# Patient Record
Sex: Male | Born: 1965 | Race: White | Hispanic: No | Marital: Married | Smoking: Former smoker
Health system: Southern US, Community
[De-identification: ages and names within clinical notes are randomized; demographics above are authoritative.]

## PROBLEM LIST (undated history)

## (undated) DIAGNOSIS — R7303 Prediabetes: Secondary | ICD-10-CM

## (undated) DIAGNOSIS — J449 Chronic obstructive pulmonary disease, unspecified: Secondary | ICD-10-CM

## (undated) DIAGNOSIS — M51369 Other intervertebral disc degeneration, lumbar region without mention of lumbar back pain or lower extremity pain: Secondary | ICD-10-CM

## (undated) DIAGNOSIS — F17200 Nicotine dependence, unspecified, uncomplicated: Secondary | ICD-10-CM

## (undated) DIAGNOSIS — M5136 Other intervertebral disc degeneration, lumbar region: Secondary | ICD-10-CM

## (undated) DIAGNOSIS — T8859XA Other complications of anesthesia, initial encounter: Secondary | ICD-10-CM

## (undated) DIAGNOSIS — M1990 Unspecified osteoarthritis, unspecified site: Secondary | ICD-10-CM

## (undated) DIAGNOSIS — J069 Acute upper respiratory infection, unspecified: Secondary | ICD-10-CM

## (undated) DIAGNOSIS — R972 Elevated prostate specific antigen [PSA]: Secondary | ICD-10-CM

## (undated) HISTORY — PX: LUMBAR DISC SURGERY: SHX700

## (undated) HISTORY — DX: Other intervertebral disc degeneration, lumbar region: M51.36

## (undated) HISTORY — PX: BACK SURGERY: SHX140

## (undated) HISTORY — DX: Nicotine dependence, unspecified, uncomplicated: F17.200

## (undated) HISTORY — DX: Other intervertebral disc degeneration, lumbar region without mention of lumbar back pain or lower extremity pain: M51.369

## (undated) HISTORY — PX: COLONOSCOPY: SHX174

## (undated) HISTORY — PX: PROSTATE BIOPSY: SHX241

---

## 2007-10-24 ENCOUNTER — Emergency Department (HOSPITAL_COMMUNITY): Admission: EM | Admit: 2007-10-24 | Discharge: 2007-10-24 | Payer: Self-pay | Admitting: Emergency Medicine

## 2008-12-31 ENCOUNTER — Ambulatory Visit: Payer: Self-pay | Admitting: Family Medicine

## 2008-12-31 DIAGNOSIS — R109 Unspecified abdominal pain: Secondary | ICD-10-CM | POA: Insufficient documentation

## 2010-02-25 ENCOUNTER — Ambulatory Visit (HOSPITAL_COMMUNITY)
Admission: RE | Admit: 2010-02-25 | Discharge: 2010-02-25 | Disposition: A | Discharge status: Home / Self Care | Payer: Self-pay | Source: Ambulatory Visit | Attending: Specialist | Admitting: Specialist

## 2010-02-25 ENCOUNTER — Encounter (HOSPITAL_COMMUNITY): Payer: Self-pay

## 2010-02-25 ENCOUNTER — Other Ambulatory Visit: Payer: Self-pay | Admitting: Specialist

## 2010-02-25 ENCOUNTER — Other Ambulatory Visit (HOSPITAL_COMMUNITY): Payer: Self-pay | Admitting: Specialist

## 2010-02-25 DIAGNOSIS — Z01818 Encounter for other preprocedural examination: Secondary | ICD-10-CM | POA: Insufficient documentation

## 2010-02-25 DIAGNOSIS — Z01810 Encounter for preprocedural cardiovascular examination: Secondary | ICD-10-CM | POA: Insufficient documentation

## 2010-02-25 DIAGNOSIS — M5126 Other intervertebral disc displacement, lumbar region: Secondary | ICD-10-CM | POA: Insufficient documentation

## 2010-02-25 DIAGNOSIS — Z01812 Encounter for preprocedural laboratory examination: Secondary | ICD-10-CM | POA: Insufficient documentation

## 2010-02-25 LAB — COMPREHENSIVE METABOLIC PANEL
ALT: 25 U/L (ref 0–53)
AST: 21 U/L (ref 0–37)
Albumin: 4.1 g/dL (ref 3.5–5.2)
CO2: 29 mEq/L (ref 19–32)
Chloride: 101 mEq/L (ref 96–112)
Creatinine, Ser: 1.16 mg/dL (ref 0.4–1.5)
GFR calc Af Amer: >60 mL/min (ref >60)
GFR calc non Af Amer: >60 mL/min (ref >60)
Potassium: 4.5 mEq/L (ref 3.5–5.1)
Sodium: 139 mEq/L (ref 135–145)
Total Bilirubin: 0.6 mg/dL (ref 0.3–1.2)

## 2010-02-25 LAB — URINALYSIS, ROUTINE W REFLEX MICROSCOPIC
Bilirubin Urine: NEGATIVE
Nitrite: NEGATIVE
Specific Gravity, Urine: 1.021 (ref 1.005–1.030)
Urobilinogen, UA: 0.2 mg/dL (ref 0.0–1.0)
pH: 7.5 (ref 5.0–8.0)

## 2010-02-25 LAB — CBC
Hemoglobin: 15.9 g/dL (ref 13.0–17.0)
MCH: 29.7 pg (ref 26.0–34.0)
Platelets: 278 10*3/uL (ref 150–400)
RBC: 5.36 MIL/uL (ref 4.22–5.81)
WBC: 18.7 10*3/uL — ABNORMAL HIGH (ref 4.0–10.5)

## 2010-02-25 LAB — APTT: aPTT: 32 seconds (ref 24–37)

## 2010-02-27 LAB — MRSA CULTURE

## 2010-03-04 ENCOUNTER — Ambulatory Visit (HOSPITAL_COMMUNITY): Payer: Worker's Compensation

## 2010-03-04 ENCOUNTER — Other Ambulatory Visit: Payer: Self-pay | Admitting: Specialist

## 2010-03-04 ENCOUNTER — Observation Stay (HOSPITAL_COMMUNITY)
Admission: RE | Admit: 2010-03-04 | Discharge: 2010-03-05 | Disposition: A | Discharge status: Home / Self Care | Payer: Worker's Compensation | Source: Ambulatory Visit | Attending: Specialist | Admitting: Specialist

## 2010-03-04 DIAGNOSIS — Z01812 Encounter for preprocedural laboratory examination: Secondary | ICD-10-CM | POA: Insufficient documentation

## 2010-03-04 DIAGNOSIS — M5126 Other intervertebral disc displacement, lumbar region: Principal | ICD-10-CM | POA: Insufficient documentation

## 2010-03-04 DIAGNOSIS — IMO0002 Reserved for concepts with insufficient information to code with codable children: Secondary | ICD-10-CM | POA: Insufficient documentation

## 2010-03-04 DIAGNOSIS — M4800 Spinal stenosis, site unspecified: Secondary | ICD-10-CM | POA: Insufficient documentation

## 2010-03-04 DIAGNOSIS — M549 Dorsalgia, unspecified: Secondary | ICD-10-CM | POA: Insufficient documentation

## 2010-03-04 LAB — CBC
HCT: 43.7 % (ref 39.0–52.0)
MCV: 86 fL (ref 78.0–100.0)
RBC: 5.08 MIL/uL (ref 4.22–5.81)
WBC: 11.8 10*3/uL — ABNORMAL HIGH (ref 4.0–10.5)

## 2010-03-13 NOTE — Op Note (Signed)
NAMERAMELO, OETKEN NO.:  [id number]  MEDICAL RECORD NO.:  [id number]           PATIENT TYPE:  O  LOCATION:  DAYL                         FACILITY:  [facility]  PHYSICIAN:  Jene Every, M.D.    DATE OF BIRTH:  08-Jun-1965  DATE OF PROCEDURE:  03/04/2010 DATE OF DISCHARGE:                              OPERATIVE REPORT   PREOPERATIVE DIAGNOSES: 1. Recurrent disk herniation L5-S1. 2. Spinal stenosis. 3. Disk degeneration.  POSTOPERATIVE DIAGNOSES: 1. Recurrent disk herniation L5-S1. 2. Spinal stenosis. 3. Disk degeneration.  PROCEDURE PERFORMED: 1. Redo decompression of L5-S1 right utilizing operating microscope. 2. Microdiskectomy L5-S1 right. 3. Foraminotomies of S1 and L5, right.  ANESTHESIA:  General.  ASSISTANT:  Roma Schanz, P.A.  BRIEF HISTORY:  This is a 45 year old with right lower extremity radicular pain secondary to recurrent disk herniations, had a previous history of a disk decompression at L5-S1 about 7 years ago, done well with that.  It was compressing the S1 nerve root.  Neural foraminal stenosis was noted.  He had a L5-S1 radiculopathy, indicated for decompression.  We discussed at length the redo decompression versus redo decompression and fusion.  He has absolutely no back pain.  He had an interval period of time in which he was asymptomatic.  We discussed decompression at L5-S1 as the more conservative approach given the above- mentioned factors, smoker as well and had not committed to tobacco cessation.  Risks and discussed were discussed including bleeding, infection, damage to vascular structures, no change in symptoms, worsening symptoms, need for repeat debridement, DVT, PE, anesthetic complication, need for fusion in the future, etc.  TECHNIQUE:  With the patient in supine position, after induction of adequate anesthesia, 1 g of Kefzol, he was placed prone on the [place] frame.  All bony prominences were well padded.   Lumbar region was prepped and draped in the usual sterile fashion.  Two 18 gauge spinal needles were utilized to localize L5-S1 interspace confirmed with x-ray. Incision was made just a centimeter off the midline at L5-S1. Subcutaneous tissue was dissected by cautery to achieve hemostasis. Dorsolumbar fascia identified via lumbar skin incision.  Paraspinous muscle elevated from lamina of L5 and S1 as well as the scar tissue and epidural fibrosis.  We skeletonized the previous laminotomy at L5-S1. Utilizing the straight curette, we detached the epidural fibrosis from the cephalad edge of the S1, caudad edge of L5.  We used a micro osteotome, removed the inferior half of L5 preserving the pars and performed a partial medial hemifacetectomy less than 50% to gain access to the L5-S1 space.  Following this, performed foraminotomies of S1 and L5 extensively.  We mobilized the thecal sac with epidural fibrosis meticulously, identified the disk herniation, confirmed it by x-ray, performed an annulotomy, and removed disk material from the disk space with a micropituitary and nerve root fragments and one large fragment was removed from beneath the subannular space.  We removed all herniated disk material.  We had good excursion of the S1 nerve root to leave the pedicle after the decompression 1 cm.  Hockey stick probe passed freely up the  foramen of L5 and of S1.  L5 root was intact as well as the S1 nerve root.  He did have a neural foraminal stenosis at L5, therefore, we performed a foraminotomy of L5 to avoid compression by the facet joint in the upright position.  Disk space was copiously irrigated with antibiotic irrigation.  There was no evidence of CSF leaks or active bleeding.  We had used bipolar electrocautery to achieve hemostasis. Copiously irrigated disk space and the surgical site with antibiotic irrigation, removed the McCullough retractor, no evidence of active bleeding or CSF  leakage, irrigated paraspinous musculature, closed fascia with 1 Vicryl interrupted figure-of-8 sutures, subcutaneous with 2-0 Vicryl, and skin was reapproximated with 4-0 subcuticular Prolene. Wound reinforced with Steri-Strips.  Sterile dressing applied.  Placed supine on the hospital bed, extubated without difficulty, and transported to Recovery in satisfactory condition.  The patient tolerated the procedure with no complications.  BLOOD LOSS:  Minimal.  SPECIMEN TO PATHOLOGY:  L5-S1 disk.     Jene Every, M.D.     Cordelia Pen  D:  03/04/2010  T:  03/04/2010  Job:  [id number]  Electronically Signed by Jene Every M.D. on 03/13/2010 05:49:56 AM

## 2010-12-16 ENCOUNTER — Other Ambulatory Visit: Payer: Self-pay | Admitting: Otolaryngology

## 2011-02-02 ENCOUNTER — Encounter (HOSPITAL_COMMUNITY): Payer: Self-pay | Admitting: Pharmacy Technician

## 2011-02-04 ENCOUNTER — Other Ambulatory Visit (HOSPITAL_COMMUNITY): Payer: Self-pay | Admitting: *Deleted

## 2011-02-04 NOTE — Pre-Procedure Instructions (Signed)
20 Christopher Wells  02/04/2011   Your procedure is scheduled on: Thursday, Februrary 7th.  Report to [facility] at 5:30AM.   Call this number if you have problems the morning of surgery: [phone]   Remember:   Do not eat food:After Midnight.  May have clear liquids: up to 4 Hours before arrival. 1:30   Clear liquids include soda, tea, black coffee, apple or grape juice, broth.    Take these medicines the morning of surgery with A SIP OF WATER: Hydroodone- Acetaminophen or Acetaminophen if needed.    Do not wear jewelry, make-up or nail polish.  Do not wear lotions, powders, or perfumes. You may wear deodorant.  Do not shave 48 hours prior to surgery.  Do not bring valuables to the hospital.  Contacts, dentures or bridgework may not be worn into surgery.  Leave suitcase in the car. After surgery it may be brought to your room.  For patients admitted to the hospital, checkout time is 11:00 AM the day of discharge.   Patients discharged the day of surgery will not be allowed to drive home.  Name and phone number of your driver: NA   Special Instructions: CHG Shower Use Special Wash: 1/2 bottle night before surgery and 1/2 bottle morning of surgery.   Please read over the following fact sheets that you were given: Pain Booklet, Coughing and Deep Breathing, Blood Transfusion Information, MRSA Information and Surgical Site Infection Prevention

## 2011-02-05 ENCOUNTER — Other Ambulatory Visit: Payer: Self-pay

## 2011-02-05 ENCOUNTER — Inpatient Hospital Stay (HOSPITAL_COMMUNITY): Admission: RE | Admit: 2011-02-05 | Discharge: 2011-02-05 | Payer: Self-pay | Source: Ambulatory Visit

## 2011-02-05 NOTE — H&P (Signed)
Christopher Wells is an 46 y.o. male.   Chief Complaint:  right leg pain and numbness with occasional low back pain HPI: Patient has a long standing history of back related issues following a work related injury.  He has undergone several back surgeries with short term relief but continues to recurrent pain. Studies show recurrent HNP at L5-S1 with severe DDD.  The patient has failed conservative treatment.  Dr Shelle Iron feels he would benefit from ALF. Risks and benefits of surgery discussed with patient and they wish to proceed.  PMH: none  PSH:  Lumbar decompression x3  FH:  Father deceased age 30 from CAD, triple bypass at age 26, Mother deceased age 44 history of DM  Social History:  Past TOB quit 1 year ago, negative for ETOH and illegal drugs, married with 9 children, family will be care givers following surgery. Allergies: No Known Allergies  No current facility-administered medications on file as of .   Medications Prior to Admission  Medication Sig Dispense Refill  . acetaminophen (TYLENOL) 500 MG tablet Take 500 mg by mouth every 6 (six) hours as needed. For pain      . HYDROcodone-acetaminophen (NORCO) 7.5-325 MG per tablet Take 1 tablet by mouth every 8 (eight) hours as needed. For pain      . Phenyleph-Doxylamine-DM-APAP (ALKA-SELTZER PLS NIGHT CLD/FLU) 5-6.25-10-325 MG CAPS Take 1 tablet by mouth 2 (two) times daily as needed. For cold        No results found for this or any previous visit (from the past 48 hour(s)). No results found.  Review of Systems: The patient denies any fever, chills, night sweats, or bleeding tendencies. CNS: No blurred double vision, seizure, headache, paralysis. RESPIRATORY: recent head congestion No shortness of breath, productive cough, or hemoptysis. CARDIOVASCULAR: No chest pain, angina, or orthopnea, GU: No dysuria, hematuria, or discharge. MUSCULOSKELETAL: Pertinent per HPI.   Vitals: Pulse: 76  Resp: 10  BP: 126/84 General appearance: alert,  cooperative and mild distress Head: Normocephalic, without obvious abnormality, atraumatic Neck: no adenopathy, no carotid bruit, no JVD, supple, symmetrical, trachea midline and thyroid not enlarged, symmetric, no tenderness/mass/nodules Back: mild pain with flexion and extension, global loss of ROM Chest wall: no tenderness Cardio: regular rate and rhythm, S1, S2 normal, no murmur, click, rub or gallop GI: soft, non-tender; bowel sounds normal; no masses,  no organomegaly Extremities: positive SLR on right negative on the left EHL 5/5  Assessment/Plan [facility]F at L5-S1 by Dr Avel Sensor R. 02/05/2011, 11:25 AM

## 2011-02-08 ENCOUNTER — Encounter (HOSPITAL_COMMUNITY): Payer: Self-pay

## 2011-02-08 ENCOUNTER — Other Ambulatory Visit (HOSPITAL_COMMUNITY): Payer: Self-pay | Admitting: Specialist

## 2011-02-08 ENCOUNTER — Encounter (HOSPITAL_COMMUNITY)
Admission: RE | Admit: 2011-02-08 | Discharge: 2011-02-08 | Disposition: A | Discharge status: Home / Self Care | Payer: Worker's Compensation | Source: Ambulatory Visit | Attending: Specialist | Admitting: Specialist

## 2011-02-08 ENCOUNTER — Ambulatory Visit (HOSPITAL_COMMUNITY)
Admission: RE | Admit: 2011-02-08 | Discharge: 2011-02-08 | Disposition: A | Discharge status: Home / Self Care | Payer: Worker's Compensation | Source: Ambulatory Visit | Attending: Otolaryngology | Admitting: Otolaryngology

## 2011-02-08 DIAGNOSIS — M5126 Other intervertebral disc displacement, lumbar region: Secondary | ICD-10-CM

## 2011-02-08 HISTORY — DX: Unspecified osteoarthritis, unspecified site: M19.90

## 2011-02-08 HISTORY — DX: Acute upper respiratory infection, unspecified: J06.9

## 2011-02-08 LAB — CBC
HCT: 44.8 % (ref 39.0–52.0)
Hemoglobin: 15.8 g/dL (ref 13.0–17.0)
MCV: 84.8 fL (ref 78.0–100.0)
RBC: 5.28 MIL/uL (ref 4.22–5.81)
RDW: 12.4 % (ref 11.5–15.5)
WBC: 10.5 10*3/uL (ref 4.0–10.5)

## 2011-02-08 LAB — COMPREHENSIVE METABOLIC PANEL
Albumin: 4 g/dL (ref 3.5–5.2)
Alkaline Phosphatase: 66 U/L (ref 39–117)
BUN: 13 mg/dL (ref 6–23)
CO2: 26 mEq/L (ref 19–32)
Chloride: 102 mEq/L (ref 96–112)
Creatinine, Ser: 1.1 mg/dL (ref 0.50–1.35)
GFR calc non Af Amer: 78 mL/min — ABNORMAL LOW (ref >90)
Potassium: 3.9 mEq/L (ref 3.5–5.1)
Total Bilirubin: 0.3 mg/dL (ref 0.3–1.2)

## 2011-02-08 LAB — TYPE AND SCREEN
ABO/RH(D): O POS
Antibody Screen: NEGATIVE

## 2011-02-08 LAB — URINALYSIS, ROUTINE W REFLEX MICROSCOPIC
Bilirubin Urine: NEGATIVE
Glucose, UA: NEGATIVE mg/dL
Ketones, ur: NEGATIVE mg/dL
Protein, ur: NEGATIVE mg/dL
pH: 6 (ref 5.0–8.0)

## 2011-02-08 LAB — DIFFERENTIAL
Basophils Absolute: 0 10*3/uL (ref 0.0–0.1)
Basophils Relative: 0 % (ref 0–1)
Eosinophils Absolute: 0.3 10*3/uL (ref 0.0–0.7)
Monocytes Absolute: 0.9 10*3/uL (ref 0.1–1.0)
Monocytes Relative: 9 % (ref 3–12)
Neutrophils Relative %: 57 % (ref 43–77)

## 2011-02-08 LAB — ABO/RH: ABO/RH(D): O POS

## 2011-02-08 LAB — PROTIME-INR: INR: 0.92 (ref 0.00–1.49)

## 2011-02-08 MED ORDER — CHLORHEXIDINE GLUCONATE 4 % EX LIQD: 60.0000 mL | Freq: Once | CUTANEOUS | Status: DC

## 2011-02-08 NOTE — Pre-Procedure Instructions (Signed)
20 WILBORN MEMBRENO  02/08/2011   Your procedure is scheduled on:  02/11/2011  Report to [facility] at 5:30 AM.  Call this number if you have problems the morning of surgery: [phone]   Remember:   Do not eat food:After Midnight.  May have clear liquids: up to 4 Hours before arrival.  Clear liquids include soda, tea, black coffee, apple or grape juice, broth.  Take these medicines the morning of surgery with A SIP OF WATER:  Nothing    Do not wear jewelry, make-up or nail polish.  Do not wear lotions, powders, or perfumes. You may wear deodorant.  Do not shave 48 hours prior to surgery.  Do not bring valuables to the hospital.  Contacts, dentures or bridgework may not be worn into surgery.  Leave suitcase in the car. After surgery it may be brought to your room.  For patients admitted to the hospital, checkout time is 11:00 AM the day of discharge.   Patients discharged the day of surgery will not be allowed to drive home.  Name and phone number of your driver:   With wife  Special Instructions: CHG Shower Use Special Wash: 1/2 bottle night before surgery and 1/2 bottle morning of surgery.   Please read over the following fact sheets that you were given: Pain Booklet, Coughing and Deep Breathing, Blood Transfusion Information, MRSA Information and Surgical Site Infection Prevention

## 2011-02-10 MED ORDER — CEFAZOLIN SODIUM 1-5 GM-% IV SOLN
1.0000 g | INTRAVENOUS | Status: AC
  Administered 2011-02-11: 1 g via INTRAVENOUS
  Filled 2011-02-10: qty 50

## 2011-02-10 MED ORDER — LACTATED RINGERS IV SOLN: INTRAVENOUS | Status: DC

## 2011-02-11 ENCOUNTER — Ambulatory Visit (HOSPITAL_COMMUNITY): Payer: Worker's Compensation

## 2011-02-11 ENCOUNTER — Ambulatory Visit (HOSPITAL_COMMUNITY): Payer: Worker's Compensation | Admitting: Anesthesiology

## 2011-02-11 ENCOUNTER — Encounter (HOSPITAL_COMMUNITY)
Admission: RE | Disposition: A | Discharge status: Home / Self Care | Payer: Self-pay | Source: Ambulatory Visit | Attending: Specialist

## 2011-02-11 ENCOUNTER — Inpatient Hospital Stay (HOSPITAL_COMMUNITY)
Admission: RE | Admit: 2011-02-11 | Discharge: 2011-02-13 | DRG: 460 | Disposition: A | Discharge status: Home / Self Care | Payer: Worker's Compensation | Source: Ambulatory Visit | Attending: Specialist | Admitting: Specialist

## 2011-02-11 ENCOUNTER — Encounter (HOSPITAL_COMMUNITY): Payer: Self-pay | Admitting: *Deleted

## 2011-02-11 ENCOUNTER — Encounter (HOSPITAL_COMMUNITY): Payer: Self-pay | Admitting: Anesthesiology

## 2011-02-11 DIAGNOSIS — M5136 Other intervertebral disc degeneration, lumbar region: Secondary | ICD-10-CM

## 2011-02-11 DIAGNOSIS — Z87891 Personal history of nicotine dependence: Secondary | ICD-10-CM

## 2011-02-11 DIAGNOSIS — M51379 Other intervertebral disc degeneration, lumbosacral region without mention of lumbar back pain or lower extremity pain: Principal | ICD-10-CM | POA: Diagnosis present

## 2011-02-11 DIAGNOSIS — M549 Dorsalgia, unspecified: Secondary | ICD-10-CM

## 2011-02-11 DIAGNOSIS — M5137 Other intervertebral disc degeneration, lumbosacral region: Principal | ICD-10-CM | POA: Diagnosis present

## 2011-02-11 DIAGNOSIS — Z8249 Family history of ischemic heart disease and other diseases of the circulatory system: Secondary | ICD-10-CM

## 2011-02-11 HISTORY — PX: ANTERIOR LUMBAR FUSION: SHX1170

## 2011-02-11 SURGERY — ANTERIOR LUMBAR FUSION 1 LEVEL
Anesthesia: General | Site: Abdomen | Wound class: Clean

## 2011-02-11 MED ORDER — HYDROMORPHONE HCL PF 1 MG/ML IJ SOLN
INTRAMUSCULAR | Status: AC
  Administered 2011-02-11: 0.5 mg via INTRAVENOUS
  Filled 2011-02-11: qty 1

## 2011-02-11 MED ORDER — ACETAMINOPHEN 325 MG PO TABS
650.0000 mg | ORAL_TABLET | ORAL | Status: DC | PRN
  Administered 2011-02-12: 650 mg via ORAL
  Filled 2011-02-11: qty 1
  Filled 2011-02-11: qty 2

## 2011-02-11 MED ORDER — NEOSTIGMINE METHYLSULFATE 1 MG/ML IJ SOLN
INTRAMUSCULAR | Status: DC | PRN
  Administered 2011-02-11: 3 mg via INTRAVENOUS

## 2011-02-11 MED ORDER — SURGIFOAM 100 EX MISC
CUTANEOUS | Status: DC | PRN
  Administered 2011-02-11: 09:00:00 via TOPICAL

## 2011-02-11 MED ORDER — 0.9 % SODIUM CHLORIDE (POUR BTL) OPTIME
TOPICAL | Status: DC | PRN
  Administered 2011-02-11: 1000 mL

## 2011-02-11 MED ORDER — HYDROMORPHONE 0.3 MG/ML IV SOLN
INTRAVENOUS | Status: DC
  Administered 2011-02-11: 17:00:00 via INTRAVENOUS
  Administered 2011-02-11: 0.1 mg via INTRAVENOUS
  Administered 2011-02-12: 0.599 mg via INTRAVENOUS
  Administered 2011-02-12: 0.1 mg via INTRAVENOUS

## 2011-02-11 MED ORDER — NALOXONE HCL 0.4 MG/ML IJ SOLN: 0.4000 mg | INTRAMUSCULAR | Status: DC | PRN

## 2011-02-11 MED ORDER — SODIUM CHLORIDE 0.9 % IV SOLN
INTRAVENOUS | Status: DC | PRN
  Administered 2011-02-11: 11:00:00 via INTRAVENOUS

## 2011-02-11 MED ORDER — DIPHENHYDRAMINE HCL 50 MG/ML IJ SOLN: 12.5000 mg | Freq: Four times a day (QID) | INTRAMUSCULAR | Status: DC | PRN

## 2011-02-11 MED ORDER — ACETAMINOPHEN 10 MG/ML IV SOLN
INTRAVENOUS | Status: AC
  Filled 2011-02-11: qty 100

## 2011-02-11 MED ORDER — SODIUM CHLORIDE 0.9 % IJ SOLN
3.0000 mL | INTRAMUSCULAR | Status: DC | PRN
  Administered 2011-02-12 (×2): 3 mL via INTRAVENOUS

## 2011-02-11 MED ORDER — PROMETHAZINE HCL 25 MG/ML IJ SOLN
12.5000 mg | Freq: Four times a day (QID) | INTRAMUSCULAR | Status: DC | PRN
  Administered 2011-02-11: 12.5 mg via INTRAVENOUS
  Filled 2011-02-11: qty 1

## 2011-02-11 MED ORDER — DOCUSATE SODIUM 100 MG PO CAPS
100.0000 mg | ORAL_CAPSULE | Freq: Two times a day (BID) | ORAL | Status: DC
  Administered 2011-02-11 – 2011-02-13 (×4): 100 mg via ORAL
  Filled 2011-02-11 (×5): qty 1

## 2011-02-11 MED ORDER — LACTATED RINGERS IV SOLN
INTRAVENOUS | Status: DC | PRN
  Administered 2011-02-11 (×2): via INTRAVENOUS

## 2011-02-11 MED ORDER — ONDANSETRON HCL 4 MG/2ML IJ SOLN
4.0000 mg | INTRAMUSCULAR | Status: DC | PRN
  Filled 2011-02-11: qty 2

## 2011-02-11 MED ORDER — PROPOFOL 10 MG/ML IV EMUL
INTRAVENOUS | Status: DC | PRN
  Administered 2011-02-11: 130 mg via INTRAVENOUS
  Administered 2011-02-11: 30 mg via INTRAVENOUS

## 2011-02-11 MED ORDER — FENTANYL CITRATE 0.05 MG/ML IJ SOLN
INTRAMUSCULAR | Status: DC | PRN
  Administered 2011-02-11: 50 ug via INTRAVENOUS
  Administered 2011-02-11: 100 ug via INTRAVENOUS
  Administered 2011-02-11 (×2): 50 ug via INTRAVENOUS
  Administered 2011-02-11: 100 ug via INTRAVENOUS

## 2011-02-11 MED ORDER — SODIUM CHLORIDE 0.9 % IJ SOLN: 9.0000 mL | INTRAMUSCULAR | Status: DC | PRN

## 2011-02-11 MED ORDER — ACETAMINOPHEN 650 MG RE SUPP: 650.0000 mg | RECTAL | Status: DC | PRN

## 2011-02-11 MED ORDER — DEXTROSE-NACL 5-0.45 % IV SOLN
INTRAVENOUS | Status: DC
  Administered 2011-02-11: 13:00:00 via INTRAVENOUS

## 2011-02-11 MED ORDER — GLYCOPYRROLATE 0.2 MG/ML IJ SOLN
INTRAMUSCULAR | Status: DC | PRN
  Administered 2011-02-11: .5 mg via INTRAVENOUS

## 2011-02-11 MED ORDER — PROMETHAZINE HCL 25 MG/ML IJ SOLN: 6.2500 mg | INTRAMUSCULAR | Status: DC | PRN

## 2011-02-11 MED ORDER — ONDANSETRON HCL 4 MG/2ML IJ SOLN
INTRAMUSCULAR | Status: DC | PRN
  Administered 2011-02-11: 4 mg via INTRAVENOUS

## 2011-02-11 MED ORDER — EPHEDRINE SULFATE 50 MG/ML IJ SOLN
INTRAMUSCULAR | Status: DC | PRN
  Administered 2011-02-11: 10 mg via INTRAVENOUS
  Administered 2011-02-11: 5 mg via INTRAVENOUS
  Administered 2011-02-11: 10 mg via INTRAVENOUS

## 2011-02-11 MED ORDER — DIPHENHYDRAMINE HCL 12.5 MG/5ML PO ELIX
12.5000 mg | ORAL_SOLUTION | Freq: Four times a day (QID) | ORAL | Status: DC | PRN
  Filled 2011-02-11: qty 5

## 2011-02-11 MED ORDER — HYDROCODONE-ACETAMINOPHEN 5-325 MG PO TABS: 1.0000 | ORAL_TABLET | ORAL | Status: DC | PRN

## 2011-02-11 MED ORDER — ROCURONIUM BROMIDE 100 MG/10ML IV SOLN
INTRAVENOUS | Status: DC | PRN
  Administered 2011-02-11: 10 mg via INTRAVENOUS
  Administered 2011-02-11: 30 mg via INTRAVENOUS

## 2011-02-11 MED ORDER — ACETAMINOPHEN 10 MG/ML IV SOLN
INTRAVENOUS | Status: DC | PRN
  Administered 2011-02-11: 1000 mg via INTRAVENOUS

## 2011-02-11 MED ORDER — HETASTARCH-ELECTROLYTES 6 % IV SOLN
INTRAVENOUS | Status: DC | PRN
  Administered 2011-02-11 (×2): via INTRAVENOUS

## 2011-02-11 MED ORDER — HYDROMORPHONE HCL PF 1 MG/ML IJ SOLN
0.2500 mg | INTRAMUSCULAR | Status: DC | PRN
  Administered 2011-02-11 (×3): 0.5 mg via INTRAVENOUS

## 2011-02-11 MED ORDER — PHENOL 1.4 % MT LIQD
1.0000 | OROMUCOSAL | Status: DC | PRN
  Filled 2011-02-11: qty 177

## 2011-02-11 MED ORDER — METHOCARBAMOL 500 MG PO TABS
500.0000 mg | ORAL_TABLET | Freq: Four times a day (QID) | ORAL | Status: DC | PRN
  Administered 2011-02-12 – 2011-02-13 (×3): 500 mg via ORAL
  Filled 2011-02-11 (×3): qty 1

## 2011-02-11 MED ORDER — MEPERIDINE HCL 25 MG/ML IJ SOLN: 6.2500 mg | INTRAMUSCULAR | Status: DC | PRN

## 2011-02-11 MED ORDER — ACETAMINOPHEN 500 MG PO TABS: 500.0000 mg | ORAL_TABLET | Freq: Four times a day (QID) | ORAL | Status: DC | PRN

## 2011-02-11 MED ORDER — MIDAZOLAM HCL 5 MG/5ML IJ SOLN
INTRAMUSCULAR | Status: DC | PRN
  Administered 2011-02-11: 3 mg via INTRAVENOUS
  Administered 2011-02-11: 1 mg via INTRAVENOUS

## 2011-02-11 MED ORDER — ONDANSETRON HCL 4 MG/2ML IJ SOLN
4.0000 mg | Freq: Four times a day (QID) | INTRAMUSCULAR | Status: DC | PRN
  Administered 2011-02-11: 4 mg via INTRAVENOUS

## 2011-02-11 MED ORDER — CEFAZOLIN SODIUM 1-5 GM-% IV SOLN
1.0000 g | Freq: Three times a day (TID) | INTRAVENOUS | Status: AC
  Administered 2011-02-11 – 2011-02-12 (×2): 1 g via INTRAVENOUS
  Filled 2011-02-11 (×2): qty 50

## 2011-02-11 MED ORDER — MENTHOL 3 MG MT LOZG: 1.0000 | LOZENGE | OROMUCOSAL | Status: DC | PRN

## 2011-02-11 MED ORDER — HYDROMORPHONE 0.3 MG/ML IV SOLN
INTRAVENOUS | Status: AC
  Administered 2011-02-11: 16:00:00
  Filled 2011-02-11: qty 25

## 2011-02-11 MED ORDER — RIVAROXABAN 10 MG PO TABS
10.0000 mg | ORAL_TABLET | Freq: Every day | ORAL | Status: DC
  Administered 2011-02-12 – 2011-02-13 (×2): 10 mg via ORAL
  Filled 2011-02-11 (×2): qty 1

## 2011-02-11 MED ORDER — METHOCARBAMOL 100 MG/ML IJ SOLN: 500.0000 mg | Freq: Four times a day (QID) | INTRAVENOUS | Status: DC | PRN

## 2011-02-11 MED ORDER — OXYCODONE-ACETAMINOPHEN 5-325 MG PO TABS
1.0000 | ORAL_TABLET | ORAL | Status: DC | PRN
Start: 2011-02-11 — End: 2011-02-13
  Administered 2011-02-12: 1 via ORAL
  Administered 2011-02-12: 2 via ORAL
  Administered 2011-02-12 (×2): 1 via ORAL
  Administered 2011-02-13: 2 via ORAL
  Administered 2011-02-13: 1 via ORAL
  Filled 2011-02-11 (×3): qty 2
  Filled 2011-02-11: qty 1
  Filled 2011-02-11: qty 2
  Filled 2011-02-11: qty 1

## 2011-02-11 MED ORDER — SODIUM CHLORIDE 0.9 % IJ SOLN: 3.0000 mL | Freq: Two times a day (BID) | INTRAMUSCULAR | Status: DC

## 2011-02-11 MED ORDER — SODIUM CHLORIDE 0.9 % IV SOLN: 250.0000 mL | INTRAVENOUS | Status: DC

## 2011-02-11 SURGICAL SUPPLY — 99 items
ADH SKN CLS APL DERMABOND .7 (GAUZE/BANDAGES/DRESSINGS) ×1
AEGIS FIXATION PIN SMOOTH ×4 IMPLANT
APL SKNCLS STERI-STRIP NONHPOA (GAUZE/BANDAGES/DRESSINGS) ×1
APPLIER CLIP 11 MED OPEN (CLIP) ×2
APR CLP MED 11 20 MLT OPN (CLIP) ×1
Aegis Lumbar Plate 21mm (Orthopedic Implant) ×2 IMPLANT
Aegis screw 5.2x24mm (Orthopedic Implant) ×8 IMPLANT
BENZOIN TINCTURE PRP APPL 2/3 (GAUZE/BANDAGES/DRESSINGS) ×2 IMPLANT
BLADE SURG 10 STRL SS (BLADE) ×2 IMPLANT
BLADE SURG ROTATE 9660 (MISCELLANEOUS) IMPLANT
CAGE LUMBAR COUGAR LG 12 10 (Cage) ×2 IMPLANT
CHLORAPREP W/TINT 26ML (MISCELLANEOUS) ×2 IMPLANT
CLIP APPLIE 11 MED OPEN (CLIP) ×1 IMPLANT
CLIP TI MEDIUM 24 (CLIP) IMPLANT
CLIP TI WIDE RED SMALL 24 (CLIP) IMPLANT
CLOTH BEACON ORANGE TIMEOUT ST (SAFETY) ×2 IMPLANT
CORDS BIPOLAR (ELECTRODE) ×2 IMPLANT
COVER MAYO STAND STRL (DRAPES) ×4 IMPLANT
COVER SURGICAL LIGHT HANDLE (MISCELLANEOUS) ×2 IMPLANT
COVER TABLE BACK 60X90 (DRAPES) ×2 IMPLANT
CUBE CONFORM 17MM (Orthopedic Implant) ×6 IMPLANT
DERMABOND ADVANCED (GAUZE/BANDAGES/DRESSINGS) ×1
DERMABOND ADVANCED .7 DNX12 (GAUZE/BANDAGES/DRESSINGS) ×1 IMPLANT
DRAIN PENROSE 1/4X12 LTX STRL (WOUND CARE) ×2 IMPLANT
DRAPE C-ARM 42X72 X-RAY (DRAPES) ×2 IMPLANT
DRAPE INCISE IOBAN 66X45 STRL (DRAPES) ×2 IMPLANT
DRAPE SURG 17X23 STRL (DRAPES) ×2 IMPLANT
DRSG MEPILEX BORDER 4X8 (GAUZE/BANDAGES/DRESSINGS) ×2 IMPLANT
ELECT BLADE 4.0 EZ CLEAN MEGAD (MISCELLANEOUS) ×2
ELECT CAUTERY BLADE 6.4 (BLADE) ×2 IMPLANT
ELECT REM PT RETURN 9FT ADLT (ELECTROSURGICAL) ×2
ELECTRODE BLDE 4.0 EZ CLN MEGD (MISCELLANEOUS) ×1 IMPLANT
ELECTRODE REM PT RTRN 9FT ADLT (ELECTROSURGICAL) ×1 IMPLANT
GAUZE SPONGE 4X4 16PLY XRAY LF (GAUZE/BANDAGES/DRESSINGS) IMPLANT
GLOVE BIO SURGEON STRL SZ8.5 (GLOVE) ×4 IMPLANT
GLOVE BIOGEL PI IND STRL 6.5 (GLOVE) ×2 IMPLANT
GLOVE BIOGEL PI IND STRL 7.5 (GLOVE) ×2 IMPLANT
GLOVE BIOGEL PI IND STRL 8.5 (GLOVE) ×2 IMPLANT
GLOVE BIOGEL PI INDICATOR 6.5 (GLOVE) ×2
GLOVE BIOGEL PI INDICATOR 7.5 (GLOVE) ×2
GLOVE BIOGEL PI INDICATOR 8.5 (GLOVE) ×2
GLOVE ECLIPSE 6.5 STRL STRAW (GLOVE) ×4 IMPLANT
GLOVE SURG SS PI 7.5 STRL IVOR (GLOVE) ×4 IMPLANT
GLOVE SURG SS PI 8.0 STRL IVOR (GLOVE) ×4 IMPLANT
GOWN EXTRA PROTECTION XL (GOWNS) ×4 IMPLANT
GOWN PREVENTION PLUS XLARGE (GOWN DISPOSABLE) ×4 IMPLANT
GOWN PREVENTION PLUS XXLARGE (GOWN DISPOSABLE) ×2 IMPLANT
GOWN STRL NON-REIN LRG LVL3 (GOWN DISPOSABLE) ×4 IMPLANT
HEMOSTAT SURGICEL 2X14 (HEMOSTASIS) IMPLANT
IMBIBE BONE MARROW ASPIRATION NEEDLE ×2 IMPLANT
INSERT FOGARTY 61MM (MISCELLANEOUS) IMPLANT
INSERT FOGARTY SM (MISCELLANEOUS) IMPLANT
KIT BASIN OR (CUSTOM PROCEDURE TRAY) ×2 IMPLANT
KIT ROOM TURNOVER OR (KITS) ×2 IMPLANT
LOOP VESSEL MAXI BLUE (MISCELLANEOUS) IMPLANT
LOOP VESSEL MINI RED (MISCELLANEOUS) IMPLANT
NEEDLE BONE MARROW 8GAX6 (NEEDLE) ×2 IMPLANT
NEEDLE SPNL 18GX3.5 QUINCKE PK (NEEDLE) ×2 IMPLANT
NS IRRIG 1000ML POUR BTL (IV SOLUTION) ×2 IMPLANT
PACK LAMINECTOMY ORTHO (CUSTOM PROCEDURE TRAY) ×2 IMPLANT
PAD ARMBOARD 7.5X6 YLW CONV (MISCELLANEOUS) ×4 IMPLANT
SPO2 SENSOR ×2 IMPLANT
SPONGE INTESTINAL PEANUT (DISPOSABLE) ×2 IMPLANT
SPONGE LAP 18X18 X RAY DECT (DISPOSABLE) ×2 IMPLANT
SPONGE LAP 4X18 X RAY DECT (DISPOSABLE) IMPLANT
SPONGE SURGIFOAM ABS GEL 100 (HEMOSTASIS) ×2 IMPLANT
STAPLER VISISTAT 35W (STAPLE) ×2 IMPLANT
STRIP CLOSURE SKIN 1/2X4 (GAUZE/BANDAGES/DRESSINGS) ×2 IMPLANT
SURGIFLO TRUKIT (HEMOSTASIS) IMPLANT
SUT MNCRL AB 3-0 PS2 18 (SUTURE) ×2 IMPLANT
SUT MNCRL AB 4-0 PS2 18 (SUTURE) IMPLANT
SUT PDS AB 1 CTX 36 (SUTURE) ×2 IMPLANT
SUT PROLENE 4 0 RB 1 (SUTURE)
SUT PROLENE 4-0 RB1 .5 CRCL 36 (SUTURE) IMPLANT
SUT PROLENE 5 0 CC1 (SUTURE) IMPLANT
SUT PROLENE 6 0 C 1 30 (SUTURE) IMPLANT
SUT PROLENE 6 0 CC (SUTURE) IMPLANT
SUT SILK 0 TIES 10X30 (SUTURE) IMPLANT
SUT SILK 2 0 TIES 10X30 (SUTURE) ×2 IMPLANT
SUT SILK 2 0SH CR/8 30 (SUTURE) IMPLANT
SUT SILK 3 0 TIES 10X30 (SUTURE) IMPLANT
SUT SILK 3 0SH CR/8 30 (SUTURE) IMPLANT
SUT VIC AB 0 CT1 27 (SUTURE)
SUT VIC AB 0 CT1 27XBRD ANBCTR (SUTURE) IMPLANT
SUT VIC AB 1 CTX 36 (SUTURE) ×2
SUT VIC AB 1 CTX36XBRD ANBCTR (SUTURE) ×1 IMPLANT
SUT VIC AB 2-0 CT1 27 (SUTURE) ×2
SUT VIC AB 2-0 CT1 36 (SUTURE) IMPLANT
SUT VIC AB 2-0 CT1 TAPERPNT 27 (SUTURE) ×1 IMPLANT
SUT VIC AB 3-0 SH 27 (SUTURE)
SUT VIC AB 3-0 SH 27X BRD (SUTURE) IMPLANT
SUT VIC AB 3-0 X1 27 (SUTURE) ×2 IMPLANT
SYR 30ML LL (SYRINGE) ×2 IMPLANT
SYR BULB IRRIGATION 50ML (SYRINGE) ×2 IMPLANT
SYRINGE 10CC LL (SYRINGE) ×2 IMPLANT
TOWEL OR 17X24 6PK STRL BLUE (TOWEL DISPOSABLE) ×4 IMPLANT
TOWEL OR 17X26 10 PK STRL BLUE (TOWEL DISPOSABLE) ×4 IMPLANT
TRAY FOLEY CATH 14FRSI W/METER (CATHETERS) ×2 IMPLANT
WATER STERILE IRR 1000ML POUR (IV SOLUTION) ×2 IMPLANT

## 2011-02-11 NOTE — Brief Op Note (Signed)
02/11/2011  11:43 AM  PATIENT:  Christopher Wells  46 y.o. male  PRE-OPERATIVE DIAGNOSIS:  Degenerative joint disease L5-S1  POST-OPERATIVE DIAGNOSIS:  Degenerative joint disease L5-S1  PROCEDURE:  Procedure(s): ANTERIOR LUMBAR FUSION 1 LEVEL ABDOMINAL EXPOSURE  SURGEON:  Surgeon(s): Javier Docker, MD V Durene Cal, MD  PHYSICIAN ASSISTANT:   ASSISTANTS: strader   ANESTHESIA:   general  EBL:  Total I/O In: 4500 [I.V.:3500; IV Piggyback:1000] Out: 430 [Urine:130; Blood:300]  BLOOD ADMINISTERED:none  DRAINS: none   LOCAL MEDICATIONS USED:  MARCAINE 20CC  SPECIMEN:  No Specimen  DISPOSITION OF SPECIMEN:  N/A  COUNTS:  YES  TOURNIQUET:  * No tourniquets in log *  DICTATION: .Other Dictation: Dictation Number [id number]  PLAN OF CARE: Admit to inpatient   PATIENT DISPOSITION:  PACU - hemodynamically stable.   Delay start of Pharmacological VTE agent (>24hrs) due to surgical blood loss or risk of bleeding:  {YES/NO/NOT APPLICABLE:20182

## 2011-02-11 NOTE — Op Note (Signed)
[facility]  Patient name: Christopher Wells MRN: [id number] DOB: Feb 11, 1965 Sex: male  02/11/2011 Pre-operative Diagnosis:Degenerative back disease, L5-S1 Post-operative diagnosis:  Same Surgeon:  Jorge Ny Assistants:  Dr. Shelle Iron Procedure:   Anterior exposure L5-S1 Anesthesia:  General Blood Loss:  See anesthesia record Specimens:  none  Indications:  The patient was seen and evaluated by Dr. Shelle Iron and felt to be a good candidate for anterior instrumentation.  I have been asked to provide anterior exposure.  I met the patient in the holding area and discussed the risks and benefits of my portion of the procedure.  This included the risk of injury to the artery and vein as well as the ureter.  I also discussed the risk of retrograde ejaculation.  Procedure:  The patient was identified in the holding area and taken to [facility] OR ROOM 08  The patient was then placed supine on the table. general anesthesia was administered.  The patient was prepped and draped in the usual sterile fashion.  A time out was called and antibiotics were administered. Flouroscopy was used to determine the appropriate level of the skin incision.  A transverse left lower quadrant incision was made.  Cautery was used to divide the subcutaneous tissue.  The fascia was divided with cautery.  I entered the appropriate retroperitoneal plane initially lateral to the rectus and then found the same plane from the medial side of the rectus.  A retractor was placed.  The peritoneal contents were then swept superior and medial.  The psoas muscle was identified as well as the genitofemoral nerve.  These were left untouched.  Next, the iliac artery was identified and reflected laterally along with the ureter.  I then exposed the iliac vein which was easily reflected laterally.  I then cleared off the L5-S1 disc space.  The Thompson retractor was then placed.  150 reverse lipped blades were placed on either  side of the spine.  The median sacral vessels were divided with cautery.  Maleable retractors were placed superiorly and inferiorly.  Flouroscopy confirmed that we were at the appropriate disc space.  Please see Dr. Jerelene Redden note for additional details of the procedure.   Disposition:  To PACU in stable condition.   Juleen China, M.D. [facility] Office: [phone] Pager:  [phone]

## 2011-02-11 NOTE — Anesthesia Postprocedure Evaluation (Signed)
  Anesthesia Post-op Note  Patient: Christopher Wells  Procedure(s) Performed:  ANTERIOR LUMBAR FUSION 1 LEVEL - ALIVL5-S1; ABDOMINAL EXPOSURE  Patient Location: PACU  Anesthesia Type: General  Level of Consciousness: awake, alert  and oriented  Airway and Oxygen Therapy: Patient Spontanous Breathing and Patient connected to nasal cannula oxygen  Post-op Pain: mild  Post-op Assessment: Post-op Vital signs reviewed, Patient's Cardiovascular Status Stable, Respiratory Function Stable, Patent Airway, No signs of Nausea or vomiting and Pain level controlled  Post-op Vital Signs: Reviewed and stable  Complications: No apparent anesthesia complications

## 2011-02-11 NOTE — Transfer of Care (Signed)
Immediate Anesthesia Transfer of Care Note  Patient: Christopher Wells  Procedure(s) Performed:  ANTERIOR LUMBAR FUSION 1 LEVEL - ALIVL5-S1; ABDOMINAL EXPOSURE  Patient Location: PACU  Anesthesia Type: General  Level of Consciousness: awake, alert , oriented and sedated  Airway & Oxygen Therapy: Patient Spontanous Breathing and Patient connected to face mask oxygen  Post-op Assessment: Report given to PACU RN, Post -op Vital signs reviewed and stable and Patient moving all extremities  Post vital signs: Reviewed and stable  Complications: No apparent anesthesia complications

## 2011-02-11 NOTE — Anesthesia Preprocedure Evaluation (Signed)
Anesthesia Evaluation  Patient identified by MRN, date of birth, ID band Patient awake    Reviewed: Allergy & Precautions, H&P , NPO status , Patient's Chart, lab work & pertinent test results  Airway Mallampati: II TM Distance: >3 FB Neck ROM: Full    Dental No notable dental hx. (+) Teeth Intact   Pulmonary neg pulmonary ROS, Recent URI , Resolved, former smoker clear to auscultation  Pulmonary exam normal       Cardiovascular neg cardio ROS Regular Normal    Neuro/Psych Negative Neurological ROS  Negative Psych ROS   GI/Hepatic negative GI ROS, Neg liver ROS,   Endo/Other  Negative Endocrine ROS  Renal/GU negative Renal ROS  Genitourinary negative   Musculoskeletal   Abdominal   Peds  Hematology negative hematology ROS (+)   Anesthesia Other Findings   Reproductive/Obstetrics negative OB ROS                           Anesthesia Physical Anesthesia Plan  ASA: II  Anesthesia Plan: General   Post-op Pain Management:    Induction: Intravenous  Airway Management Planned: Oral ETT  Additional Equipment: Arterial line  Intra-op Plan:   Post-operative Plan: Extubation in OR  Informed Consent: I have reviewed the patients History and Physical, chart, labs and discussed the procedure including the risks, benefits and alternatives for the proposed anesthesia with the patient or authorized representative who has indicated his/her understanding and acceptance.   Dental advisory given  Plan Discussed with: CRNA  Anesthesia Plan Comments:         Anesthesia Quick Evaluation

## 2011-02-11 NOTE — Interval H&P Note (Signed)
History and Physical Interval Note:  02/11/2011 7:17 AM  Christopher Wells  has presented today for surgery, with the diagnosis of DDD L5-S1  The various methods of treatment have been discussed with the patient and family. After consideration of risks, benefits and other options for treatment, the patient has consented to  Procedure(s): ANTERIOR LUMBAR FUSION 1 LEVEL ABDOMINAL EXPOSURE as a surgical intervention .  The patients' history has been reviewed, patient examined, no change in status, stable for surgery.  I have reviewed the patients' chart and labs.  Questions were answered to the patient's satisfaction.     Elisama Thissen C

## 2011-02-11 NOTE — Preoperative (Signed)
Beta Blockers   Reason not to administer Beta Blockers:Not Applicable 

## 2011-02-11 NOTE — Plan of Care (Signed)
Problem: Consults Goal: Diagnosis - Spinal Surgery Thoraco/Lumbar Spine Fusion     

## 2011-02-12 ENCOUNTER — Inpatient Hospital Stay (HOSPITAL_COMMUNITY): Payer: Worker's Compensation

## 2011-02-12 DIAGNOSIS — M5136 Other intervertebral disc degeneration, lumbar region: Secondary | ICD-10-CM | POA: Diagnosis present

## 2011-02-12 LAB — BASIC METABOLIC PANEL
CO2: 27 mEq/L (ref 19–32)
Calcium: 7.9 mg/dL — ABNORMAL LOW (ref 8.4–10.5)
Chloride: 105 mEq/L (ref 96–112)
Glucose, Bld: 122 mg/dL — ABNORMAL HIGH (ref 70–99)
Potassium: 4 mEq/L (ref 3.5–5.1)
Sodium: 137 mEq/L (ref 135–145)

## 2011-02-12 LAB — CBC
Hemoglobin: 11.1 g/dL — ABNORMAL LOW (ref 13.0–17.0)
MCH: 29.5 pg (ref 26.0–34.0)
RBC: 3.76 MIL/uL — ABNORMAL LOW (ref 4.22–5.81)
WBC: 11.8 10*3/uL — ABNORMAL HIGH (ref 4.0–10.5)

## 2011-02-12 MED ORDER — OXYCODONE-ACETAMINOPHEN 5-325 MG PO TABS: 1.0000 | ORAL_TABLET | ORAL | Status: AC | PRN

## 2011-02-12 MED ORDER — RIVAROXABAN 10 MG PO TABS: 10.0000 mg | ORAL_TABLET | Freq: Every day | ORAL | Status: DC

## 2011-02-12 MED FILL — Sodium Chloride Irrigation Soln 0.9%: Qty: 3000 | Status: AC

## 2011-02-12 MED FILL — Sodium Chloride IV Soln 0.9%: INTRAVENOUS | Qty: 1000 | Status: AC

## 2011-02-12 MED FILL — Heparin Sodium (Porcine) Inj 1000 Unit/ML: INTRAMUSCULAR | Qty: 30 | Status: AC

## 2011-02-12 NOTE — Op Note (Signed)
[name], OMLOR NO.:  [id number]  MEDICAL RECORD NO.:  [id number]  LOCATION:  5033                         FACILITY:  [facility]  PHYSICIAN:  Jene Every, M.D.    DATE OF BIRTH:  [birth year]  DATE OF PROCEDURE: DATE OF DISCHARGE:                              OPERATIVE REPORT   PREOPERATIVE DIAGNOSIS:  Discogenic mechanical back pain secondary to disk degeneration L5-S1, recurrent disk protrusion, history of a lumbar decompression posteriorly.  POSTOPERATIVE DIAGNOSIS:  Discogenic mechanical back pain secondary to disk degeneration L5-S1, recurrent disk protrusion, history of a lumbar decompression posteriorly.  PROCEDURE PERFORMED: 1. Anterior lumbar interbody fusion, L5-S1, with utilization of the     AEGIS lumbar plate 21 mm and the AEGIS carbon fiber cage. 2. Autologous and allograft bone graft, L5-S1 utilizing the CONFORM     allograft cube and bone marrow aspirate. 3. Bone marrow aspirate L5 vertebral body. 4. Three hours of free running intraoperative EMG and SSEP evaluation.  ANESTHESIA:  General.  ASSISTANT:  Roma Schanz, P.A.  BRIEF HISTORY:  This is a 46 year old male wit end-stage discogenic back pain, previous history of lumbar decompression, recurrent back pain and leg pain, epidural fibrosis, neural foraminal narrowing, small recurrent disk posteriorly.  He was indicated for anterior lumbar interbody fusion in an effort to restore height at the disk space, the foramen, removal of disk protrusion posteriorly, interbody fixation.  Risk and benefits discussed including bleeding, infection, damage to neurovascular structures, epidural fibrosis, DVT, PE, anesthetic complications, malunion, nonunion, etc.  TECHNIQUE:  The patient supine in position, slight Trendelenburg. Lumbar anterior abdominal region was prepped and draped in usual sterile fashion under x-ray and intraoperative neural monitoring throughout the case EMGs and free  running conductivity and pulse oximetry in the left lower extremity.  The transverse incision in the left lower quadrant of the abdomen was performed by Dr. Durene Cal.  The standard retroperitoneal approach performed after the placement of the self- retaining retractors and visualization of the 5-1 space and confirmation by 18-gauge spinal needle and C-arm x-ray.  Attention was turned towards the removal of the degenerated disk.  Just prior to that, we changed the Jamshidi needle and at L5 vertebral body impacted into the L5 vertebral body and aspirated 10 mL of bone marrow aspirate.  This was then removed and bone wax placed on the aperture.  A trial was placed medial, lateral, and in the center line, which was established in the AP plane, 10 blade was used to remove the anulus and the anterior longitudinal ligament.  This was then further excised with a Leksell rongeur.  Then we meticulously curetted the endplates to remove any cartilaginous endplate without compromising the integrity of the endplate.  Good bleeding tissue was noted on the 5 as well as the S1 vertebral body. This was sequentially performed from anterior to posterior and the posterior margin.  The annulus was released from the S1 vertebral body and 5, by a small micro curette and a 1 mm Kerrison removing a portion of the spur posteriorly and on the right was a small recurrent disk protrusion, we were able to remove the disk and this corner consistent with  that seen on the MRI.  No epidural venous bleeding was encountered or CSF leaking.  Following this and curetting of the endplates, we then under x-ray performed parallel distraction with the lamina spreader.  We then turned towards trialing the interspace with 2 separate devices, the SynFix and the AEGIS carbon fiber cage.  SynFix was addressed first, then the carbon fiber cage which seemed to give a better fit and due to the angle of the sacral promontory, we felt  the end plate screws would be facilitated with AEGIS device.  We then selected the AEGIS plate, the AEGIS caged the large medial lateral 12 mm in height and a 10 degree lordosis.  We trialed a 5, which was unacceptable and returned to the 10.  We checked this in the AP and lateral plane.  Following that, we used a bone marrow aspirate combined with conformed bone graft cube, it was reconstituted with that, packed into the cage, and then the AP and lateral plane inserted in the optimal position.  There was excellent resistance to pullout noted and this was in the appropriate position. We then placed our agents lumbar plate measuring 21 mm after trialing of 19, 21 was optimum.  It was held with transection screws and the angle of the screw insertion was facilitated by this initial placement.  The awl was used in the 4 corners of the plate and 24 mm length screws, for which were 5.2 mm in width.  We then advanced an excellent purchase was obtained.  The end plate, and the vertebral body L5 and S1.  There was a final torque lock that was then utilized to lock each of the screws.  AP and lateral plane, we had excellent restoration of the disk space and of the foramen.  Excellent contact, medial, lateral, anterior, posterior cephalad, and caudad.  Good expansion of the conformed acute within the cage was noted as well.  Wound was copiously irrigated just prior to insertion of the cage as well.  Then we had meticulous electrocautery to cauterize some surface vessels and then sequentially released our retractors, no evidence of active bleeding.  The ureter was intact. There was no leaking of urine.  The vessels were intact as well.  Dr. Jean Rosenthal was present during that removal of retractors.  Inspection revealed no evidence of rents in the peritoneum and therefore, closed the rectus fascia with #1 Vicryl in a figure-of-eight sutures, running locking stitch, using a malleable to protect  soft tissues posterior to that.  After copious irrigation, subcu with 2-0 Vicryl simple sutures, skin was reapproximated with 4-0 subcuticular Monocryl, reinforced Steri-Strips.  Sterile dressing applied.  AP x-ray revealed no retained hardware.  Counts were correct.  Blood loss was 200 mL.  The patient had no interruption of flow to the left lower extremity during the case, 2 episodes of approximately 1 minute duration.  There was no changes in the free running EMG or SSEPs that were noted either and that was performed for the 3-hour case.  The patient was then extubated without difficulty, and transported to recovery in satisfactory condition.  The patient tolerated procedure well.  No complications.  Again, Roma Schanz, was the assistant.     Jene Every, M.D.     Cordelia Pen  D:  02/11/2011  T:  02/12/2011  Job:  [id number]

## 2011-02-12 NOTE — Evaluation (Signed)
Physical Therapy Evaluation Patient Details Name: Christopher Wells MRN: [id number] DOB: 01/16/1965 Today's Date: 02/12/2011  Problem List:  Patient Active Problem List  Diagnoses  . ABDOMINAL PAIN  . DDD (degenerative disc disease), lumbar    Past Medical History:  Past Medical History  Diagnosis Date  . Recurrent upper respiratory infection (URI)     took z-pak, for URI- started 02/03/2011  . Arthritis     DDD- lumbar   Past Surgical History:  Past Surgical History  Procedure Date  . Back surgery     lumbar surgery- x3 prev. (first 2 in [place])     PT Assessment/Plan/Recommendation PT Assessment Clinical Impression Statement: Pt. is a 46 y/o male s/p L5-S1 ALIF.  Pt. mobilizing well however limited by pain and activity tolerance. PT Recommendation/Assessment: Patient will need skilled PT in the acute care venue PT Problem List: Decreased activity tolerance;Decreased mobility;Decreased knowledge of use of DME;Decreased knowledge of precautions Barriers to Discharge: None PT Therapy Diagnosis : Difficulty walking;Acute pain PT Plan PT Frequency: Min 5X/week PT Treatment/Interventions: DME instruction;Gait training;Stair training;Therapeutic activities;Patient/family education PT Recommendation Follow Up Recommendations: Supervision - Intermittent;No PT follow up Equipment Recommended: Rolling walker with 5" wheels;Gilmer Mor (will determine RW v/s cane next tx session) PT Goals  Acute Rehab PT Goals PT Goal Formulation: With patient/family Time For Goal Achievement: 7 days Pt will Roll Supine to Right Side: Independently PT Goal: Rolling Supine to Right Side - Progress: Goal set today Pt will Roll Supine to Left Side: Independently PT Goal: Rolling Supine to Left Side - Progress: Goal set today Pt will go Supine/Side to Sit: Independently PT Goal: Supine/Side to Sit - Progress: Goal set today Pt will go Sit to Supine/Side: Independently PT Goal: Sit to Supine/Side - Progress:  Goal set today Pt will go Sit to Stand: with modified independence PT Goal: Sit to Stand - Progress: Goal set today Pt will go Stand to Sit: with modified independence PT Goal: Stand to Sit - Progress: Goal set today Pt will Ambulate: >150 feet;with modified independence;with least restrictive assistive device PT Goal: Ambulate - Progress: Goal set today Pt will Go Up / Down Stairs: 3-5 stairs;with modified independence;with least restrictive assistive device PT Goal: Up/Down Stairs - Progress: Goal set today  PT Evaluation Precautions/Restrictions  Precautions Precautions: Back Required Braces or Orthoses: Yes Spinal Brace: Applied in sitting position Prior Functioning  Home Living Lives With: Spouse;Son;Daughter Receives Help From: Family Type of Home: House Home Layout: One level Home Access: Stairs to enter Entrance Stairs-Rails: None Entrance Stairs-Number of Steps: 2 Bathroom Shower/Tub: Psychologist, counselling;Door [employer] Toilet: Standard Bathroom Accessibility: Yes How Accessible: Accessible via walker Home Adaptive Equipment: None Prior Function Level of Independence: Independent with basic ADLs;Independent with homemaking with ambulation;Independent with transfers;Independent with gait Able to Take Stairs?: Yes Driving: Yes Vocation: Workers comp Producer, television/film/video: Awake/alert Overall Cognitive Status: Appears within functional limits for tasks assessed Orientation Level: Oriented X4 Sensation/Coordination Sensation Light Touch: Appears Intact Stereognosis: Appears Intact Hot/Cold: Appears Intact Proprioception: Appears Intact Coordination Gross Motor Movements are Fluid and Coordinated: Yes Fine Motor Movements are Fluid and Coordinated: Yes Extremity Assessment RUE Assessment RUE Assessment: Within Functional Limits LUE Assessment LUE Assessment: Within Functional Limits RLE Assessment RLE Assessment: Within Functional Limits LLE  Assessment LLE Assessment: Within Functional Limits Mobility (including Balance) Bed Mobility Bed Mobility: Yes Rolling Left: 5: Supervision Rolling Left Details (indicate cue type and reason): HOB flat with VCs for technique Left Sidelying to Sit: 5: Supervision;HOB flat Left  Sidelying to Sit Details (indicate cue type and reason): VCs for safe technique Transfers Transfers: Yes Sit to Stand: 5: Supervision;From elevated surface;From bed Sit to Stand Details (indicate cue type and reason): bed elevated and unable to lower due to bed malfunction; also simulated home environment Stand to Sit: 5: Supervision;To chair/3-in-1;With upper extremity assist Stand to Sit Details: min cues for technique Ambulation/Gait Ambulation/Gait: Yes Ambulation/Gait Assistance: 5: Supervision (minguard A) Ambulation Distance (Feet): 150 Feet Assistive device: Rolling walker Gait Pattern: Within Functional Limits Stairs: No Wheelchair Mobility Wheelchair Mobility: No  Posture/Postural Control Posture/Postural Control: No significant limitations Balance Balance Assessed: No Exercise    End of Session PT - End of Session Equipment Utilized During Treatment: Gait belt;Back brace Activity Tolerance: Patient tolerated treatment well;Patient limited by pain Patient left: in chair;with call bell in reach;with family/visitor present (spouse) Nurse Communication: Mobility status for transfers;Mobility status for ambulation General Behavior During Session: [facility] for tasks performed Cognition: [facility] for tasks performed  Feltis, Nicki Reaper 02/12/2011, 9:37 AM  Nicki Reaper. Feltis, PT, DPT [phone]

## 2011-02-12 NOTE — Progress Notes (Signed)
Occupational Therapy Evaluation Patient Details Name: Christopher Wells MRN: [id number] DOB: 11-30-1965 Today's Date: 02/12/2011  Problem List:  Patient Active Problem List  Diagnoses  . ABDOMINAL PAIN  . DDD (degenerative disc disease), lumbar    Past Medical History:  Past Medical History  Diagnosis Date  . Recurrent upper respiratory infection (URI)     took z-pak, for URI- started 02/03/2011  . Arthritis     DDD- lumbar   Past Surgical History:  Past Surgical History  Procedure Date  . Back surgery     lumbar surgery- x3 prev. (first 2 in [place])     OT Assessment/Plan/Recommendation OT Assessment Clinical Impression Statement: 46 yo male s/p thoraco lumbar spine fusion. As stated above, completed all ADL retraining using nec AE and DME. Pt will benefit from use of AE for ADL. Pt will benefit from 3 in 1. DIscussed use of back precautions in ADL and IADL tasks. Pt/wife verbally understood all tasks. No further OT needed. OT Recommendation/Assessment: Patient does not need any further OT services OT Recommendation Follow Up Recommendations: No OT follow up Equipment Recommended: 3 in 1 bedside comode OT Goals Acute Rehab OT Goals OT Goal Formulation:  (eval only)  OT Evaluation Precautions/Restrictions  Precautions Precautions: Back Precaution Booklet Issued: Yes (comment) Required Braces or Orthoses: Yes Spinal Brace: Applied in sitting position Restrictions Weight Bearing Restrictions: No Prior Functioning Home Living Lives With: Spouse;Son;Daughter Receives Help From: Family Type of Home: House Home Layout: One level Home Access: Stairs to enter Entrance Stairs-Rails: None Entrance Stairs-Number of Steps: 2 Bathroom Shower/Tub: Psychologist, counselling;Door [employer] Toilet: Standard Bathroom Accessibility: Yes How Accessible: Accessible via walker Home Adaptive Equipment: None Prior Function Level of Independence: Independent with basic ADLs;Independent with  homemaking with ambulation;Independent with transfers;Independent with gait Able to Take Stairs?: Yes Driving: Yes Vocation: Workers comp ADL ADL Eating/Feeding: Performed;Independent Where Assessed - Eating/Feeding: Edge of bed Grooming: Simulated;Independent Where Assessed - Grooming: Sitting, bed Upper Body Bathing: Simulated;Set up Where Assessed - Upper Body Bathing: Supine, head of bed up Lower Body Bathing: Simulated;Set up Where Assessed - Lower Body Bathing: Supine, head of bed up;Sitting, bed Upper Body Dressing: Simulated;Supervision/safety Where Assessed - Upper Body Dressing: Supine, head of bed up Lower Body Dressing: Simulated;Supervision/safety Where Assessed - Lower Body Dressing: Supine, head of bed up Toilet Transfer: Simulated;Supervision/safety Toileting - Hygiene: Independent Tub/Shower Transfer: Supervision/safety Equipment Used: Long-handled shoe horn;Long-handled sponge;Reacher;Rolling walker;Sock aid Ambulation Related to ADLs: supervision ADL Comments: completted all education regarding ADL retraining and functional mobility for ADL floowing back precautions. Pt given handout on precautions, in addition to handout on AE and availability. Wife and pt verbally demonstrated understanding of all task. Pt would benefit from 3 in 1. No further OT needs. Vision/Perception  Vision - History Baseline Vision: No visual deficits Cognition Cognition Arousal/Alertness: Awake/alert Overall Cognitive Status: Appears within functional limits for tasks assessed Orientation Level: Oriented X4 Sensation/Coordination Sensation Light Touch: Appears Intact Proprioception: Appears Intact Coordination Gross Motor Movements are Fluid and Coordinated: Yes Fine Motor Movements are Fluid and Coordinated: Yes Extremity Assessment RUE Assessment RUE Assessment: Within Functional Limits LUE Assessment LUE Assessment: Within Functional Limits Mobility  Bed Mobility Bed  Mobility: No Transfers Transfers: Yes Sit to Stand: 5: Supervision;From elevated surface;From bed   End of Session OT - End of Session Activity Tolerance: Patient tolerated treatment well Patient left: in bed;with call bell in reach;with family/visitor present General Behavior During Session: [name] for tasks performed Cognition: [facility] for tasks performed  Christopher Wells,Christopher Wells 02/12/2011, 12:05 PM  Luisa Dago, OTR/L  [phone] 02/12/2011

## 2011-02-12 NOTE — Progress Notes (Signed)
[facility]  Subjective  - POD #1  Doing well without complaints   Physical Exam:  Incision ok Palpable left PT       Assessment/Plan:  POD #1  Stable D/c per Dr. Shelle Iron, likely tomorrow  Myra Gianotti IV, V. WELLS 02/12/2011 3:25 PM --  Ceasar Mons Vitals:   02/12/11 0610  BP: 96/57  Pulse: 110  Temp: 100.2 F (37.9 C)  Resp: 16    Intake/Output Summary (Last 24 hours) at 02/12/11 1525 Last data filed at 02/12/11 0600  Gross per 24 hour  Intake   1800 ml  Output   1500 ml  Net    300 ml     Laboratory CBC    Component Value Date/Time   WBC 11.8* 02/12/2011 0455   HGB 11.1* 02/12/2011 0455   HCT 32.4* 02/12/2011 0455   PLT 183 02/12/2011 0455    BMET    Component Value Date/Time   NA 137 02/12/2011 0455   K 4.0 02/12/2011 0455   CL 105 02/12/2011 0455   CO2 27 02/12/2011 0455   GLUCOSE 122* 02/12/2011 0455   BUN 10 02/12/2011 0455   CREATININE 1.14 02/12/2011 0455   CALCIUM 7.9* 02/12/2011 0455   GFRNONAA 75* 02/12/2011 0455   GFRAA 87* 02/12/2011 0455    COAG Lab Results  Component Value Date   INR 0.92 02/08/2011   INR 0.95 02/25/2010   No results found for this basename: PTT    Antibiotics Anti-infectives     Start     Dose/Rate Route Frequency Ordered Stop   02/11/11 1700   ceFAZolin (ANCEF) IVPB 1 g/50 mL premix        1 g 100 mL/hr over 30 Minutes Intravenous Every 8 hours 02/11/11 1525 02/12/11 0131   02/10/11 1530   ceFAZolin (ANCEF) IVPB 1 g/50 mL premix        1 g 100 mL/hr over 30 Minutes Intravenous 60 min pre-op 02/10/11 1519 02/11/11 0810           V. Charlena Cross, M.D. [facility] [place] Office: [phone] Pager:  [phone]

## 2011-02-12 NOTE — Progress Notes (Signed)
Utilization review completed. De Libman, RN, BSN. 02/12/11  

## 2011-02-12 NOTE — Progress Notes (Signed)
Subjective: 1 Day Post-Op Procedure(s) (LRB): ANTERIOR LUMBAR FUSION 1 LEVEL (N/A) ABDOMINAL EXPOSURE (N/A) Patient reports pain as 4 on 0-10 scale.    Objective: Vital signs in last 24 hours: Temp:  [97.7 F (36.5 C)-100.2 F (37.9 C)] 100.2 F (37.9 C) (02/08 0610) Pulse Rate:  [72-115] 110  (02/08 0610) Resp:  [13-25] 16  (02/08 0610) BP: (96-133)/(46-62) 96/57 mmHg (02/08 0610) SpO2:  [2 %-100 %] 99 % (02/08 0610) Arterial Line BP: (108-161)/(52-70) 109/52 mmHg (02/07 1440) FiO2 (%):  [93 %] 93 % (02/08 0051)  Intake/Output from previous day: 02/07 0701 - 02/08 0700 In: 6525 [P.O.:600; I.V.:4925; IV Piggyback:1000] Out: 2080 [Urine:1780; Blood:300] Intake/Output this shift:     Basename 02/12/11 0455  HGB 11.1*    Basename 02/12/11 0455  WBC 11.8*  RBC 3.76*  HCT 32.4*  PLT 183    Basename 02/12/11 0455  NA 137  K 4.0  CL 105  CO2 27  BUN 10  CREATININE 1.14  GLUCOSE 122*  CALCIUM 7.9*   No results found for this basename: LABPT:2,INR:2 in the last 72 hours  Neurologically intact ABD soft No DVT. Assessment/Plan: 1 Day Post-Op Procedure(s) (LRB): ANTERIOR LUMBAR FUSION 1 LEVEL (N/A) ABDOMINAL EXPOSURE (N/A) Advance diet Up with therapy Plan for discharge tomorrow, ISB for atelectasis. Check Lumbar xray when OOB. WBC due to decadron, stress. Doppler  Vinetta Brach C 02/12/2011, 7:16 AM

## 2011-02-13 LAB — CBC
HCT: 33.9 % — ABNORMAL LOW (ref 39.0–52.0)
Hemoglobin: 11.6 g/dL — ABNORMAL LOW (ref 13.0–17.0)
MCH: 29.6 pg (ref 26.0–34.0)
MCV: 86.5 fL (ref 78.0–100.0)
RBC: 3.92 MIL/uL — ABNORMAL LOW (ref 4.22–5.81)

## 2011-02-13 NOTE — Progress Notes (Signed)
Pt. Discharged via private vehicle accompanied by spouse.  Escorted to exit by nurse tech via wheelchair.  All discharge instructions, prescriptions, and appointments given to patient and explained to family member prior to departure.

## 2011-02-13 NOTE — Progress Notes (Signed)
   CARE MANAGEMENT NOTE 02/13/2011  Patient:  Christopher Wells, Christopher Wells   Account Number:  [id number]  Date Initiated:  02/13/2011  Documentation initiated by:  [facility]  Subjective/Objective Assessment:   ANTERIOR LUMBAR FUSION 1 LEVEL     Action/Plan:   Anticipated DC Date:  02/13/2011   Anticipated DC Plan:  HOME/SELF CARE      DC Planning Services  CM consult      Choice offered to / List presented to:     DME arranged  [facility]  3-N-1      DME agency  [facility]        Status of service:  Completed, signed off Medicare Important Message given?   (If response is "NO", the following Medicare IM given date fields will be blank) Date Medicare IM given:   Date Additional Medicare IM given:    Discharge Disposition:  HOME/SELF CARE  Per UR Regulation:    Comments:  2/92/2013 1200 Spoke to pt and states he does not need HH PT/OT at this time. Explained NCM will speak with Jorge Ny about his care and orders. Pt gave permission to speak with Jorge Ny, Case Worker with [employer]. Received call back from [facility] and states she will call [facility] with billing information for DME. Will order 3n1 for pt with [facility]. Requested d/c instructions, orders and PT/OT notes be faxed to [phone]. Isidoro Donning RN CCM Case Mgmt phone [phone]  02/13/2011 1130 Spoke to pt and states his Worker's Comp is with Harle Stanford, Jorge Ny [phone]. Left message for Ms. Gatey.  Explained NCM will contact CM to get his 3n1. Contacted [facility] for RW for home for scheduled d/c today. Isidoro Donning RN CCM Case Mgmt phone [phone]

## 2011-02-13 NOTE — Progress Notes (Signed)
Physical Therapy Treatment Patient Details Name: SELMER ADDUCI MRN: [id number] DOB: 11-06-1965 Today's Date: 02/13/2011  PT Assessment/Plan  PT - Assessment/Plan Comments on Treatment Session: pt prefers RW over [facility] due to it provides more support and ofsets pain better.  PT Plan: Discharge plan remains appropriate;Frequency remains appropriate PT Frequency: Min 5X/week Follow Up Recommendations: Supervision - Intermittent;No PT follow up Equipment Recommended: 3 in 1 bedside comode;Rolling walker with 5" wheels PT Goals  Acute Rehab PT Goals PT Goal: Rolling Supine to Right Side - Progress: Other (comment) (not addressed today) PT Goal: Rolling Supine to Left Side - Progress: Progressing toward goal PT Goal: Supine/Side to Sit - Progress: Progressing toward goal PT Goal: Sit to Supine/Side - Progress: Other (comment) (not addressed today) PT Goal: Sit to Stand - Progress: Met PT Goal: Stand to Sit - Progress: Met PT Goal: Ambulate - Progress: Met PT Goal: Up/Down Stairs - Progress: Progressing toward goal  PT Treatment Precautions/Restrictions  Precautions Precautions: Back Precaution Booklet Issued: Yes (comment) Precaution Comments: pt independent with recall and demo of 3/3 back precautions Required Braces or Orthoses: Yes Spinal Brace: Lumbar corset;Applied in sitting position (pt independent with don/doff of brace) Restrictions Weight Bearing Restrictions: No Mobility (including Balance) Bed Mobility Rolling Left: 6: Modified independent (Device/Increase time) Rolling Left Details (indicate cue type and reason): HOB flat and no rails needed. no assist or cues needed. Left Sidelying to Sit: 6: Modified independent (Device/Increase time) Left Sidelying to Sit Details (indicate cue type and reason): HOB flat and no rails. no cues or assit needed. Transfers Sit to Stand: 6: Modified independent (Device/Increase time);From bed;With upper extremity assist Sit to Stand Details  (indicate cue type and reason): no cues or assit needed. bed in lowest position. Stand to Sit: 6: Modified independent (Device/Increase time);To chair/3-in-1;With upper extremity assist;With armrests Stand to Sit Details: no cues or assit needed. Ambulation/Gait Ambulation/Gait Assistance: 5: Supervision Ambulation/Gait Assistance Details (indicate cue type and reason): cues for sequency with SPC and gait. Ambulation Distance (Feet): 200 Feet Assistive device: Straight cane Gait Pattern: Step-through pattern;Within Functional Limits Stairs: Yes Stairs Assistance: 5: Supervision Stairs Assistance Details (indicate cue type and reason): 2 steps x 2 trials: 1 with straight cane and 1 with HHA. cues for sequency (up with strong leg first and down with weak leg first) Stair Management Technique: Step to pattern;Forwards;With cane;Other (comment) Number of Stairs: 2  (2 x2 trials)  Posture/Postural Control Posture/Postural Control: No significant limitations Exercise    End of Session PT - End of Session Equipment Utilized During Treatment: Gait belt;Back brace Activity Tolerance: Patient tolerated treatment well Patient left: in chair;with call bell in reach;with family/visitor present Nurse Communication: Mobility status for transfers;Mobility status for ambulation General Behavior During Session: [facility] for tasks performed Cognition: [facility] for tasks performed  Sallyanne Kuster 02/13/2011, 11:14 AM  Sallyanne Kuster, PTA Office- [phone]

## 2011-02-13 NOTE — Progress Notes (Signed)
   CARE MANAGEMENT NOTE 02/13/2011  Patient:  Christopher Wells, Christopher Wells   Account Number:  [id number]  Date Initiated:  02/13/2011  Documentation initiated by:  [facility]  Subjective/Objective Assessment:   ANTERIOR LUMBAR FUSION 1 LEVEL     Action/Plan:   Anticipated DC Date:  02/13/2011   Anticipated DC Plan:  HOME/SELF CARE      DC Planning Services  CM consult      Choice offered to / List presented to:     DME arranged  Levan Hurst      DME agency  [facility]        Status of service:  In process, will continue to follow Medicare Important Message given?   (If response is "NO", the following Medicare IM given date fields will be blank) Date Medicare IM given:   Date Additional Medicare IM given:    Discharge Disposition:  HOME/SELF CARE  Per UR Regulation:    Comments:  02/13/2011 1130 Spoke to pt and states his Worker's Comp is with Human resources officer, Jorge Ny [phone]. Left message for Ms. Gatey.  Explained NCM will contact CM to get his 3n1. Contacted [facility] for RW for home for scheduled d/c today. Isidoro Donning RN CCM Case Mgmt phone [phone]

## 2011-02-15 NOTE — Discharge Summary (Signed)
Patient ID: Christopher Wells MRN: [id number] DOB/AGE: 01/27/1965 46 y.o.  Admit date: 02/11/2011 Discharge date: 02/15/2011  Admission Diagnoses:  Principal Problem:  *DDD (degenerative disc disease), lumbar  Past Medical History  Diagnosis Date  . Recurrent upper respiratory infection (URI)     took z-pak, for URI- started 02/03/2011  . Arthritis     DDD- lumbar   Discharge Diagnoses:  Same s/p ALF   Surgeries: Procedure(s): ANTERIOR LUMBAR FUSION 1 LEVEL ABDOMINAL EXPOSURE on 02/11/2011   Consultants: Treatment Team:  Juleen China, MD  Discharged Condition: Improved  Hospital Course: Christopher Wells is an 46 y.o. male who was admitted 02/11/2011 for operative treatment ofDDD (degenerative disc disease), lumbar. Patient has severe unremitting pain that affects sleep, daily activities, and work/hobbies. After pre-op clearance the patient was taken to the operating room on 02/11/2011 and underwent  Procedure(s): ANTERIOR LUMBAR FUSION 1 LEVEL ABDOMINAL EXPOSURE.    Patient was given perioperative antibiotics: Anti-infectives     Start     Dose/Rate Route Frequency Ordered Stop   02/11/11 1700   ceFAZolin (ANCEF) IVPB 1 g/50 mL premix        1 g 100 mL/hr over 30 Minutes Intravenous Every 8 hours 02/11/11 1525 02/12/11 0131   02/10/11 1530   ceFAZolin (ANCEF) IVPB 1 g/50 mL premix        1 g 100 mL/hr over 30 Minutes Intravenous 60 min pre-op 02/10/11 1519 02/11/11 0810           Patient was given sequential compression devices, early ambulation, and chemoprophylaxis to prevent DVT.  Patient benefited maximally from hospital stay and there were no complications.    Recent vital signs: No data found.    Recent laboratory studies:  Basename 02/13/11 0600  WBC 13.4*  HGB 11.6*  HCT 33.9*  PLT 174  NA --  K --  CL --  CO2 --  BUN --  CREATININE --  GLUCOSE --  INR --  CALCIUM --     Discharge Medications:   Medication List  As of 02/15/2011  8:24 AM   STOP  taking these medications         acetaminophen 500 MG tablet      azithromycin 500 MG tablet         TAKE these medications         ALKA-SELTZER PLS NIGHT CLD/FLU 5-6.25-10-325 MG Caps   Generic drug: Phenyleph-Doxylamine-DM-APAP   Take 1 tablet by mouth 2 (two) times daily as needed. For cold      HYDROcodone-acetaminophen 7.5-325 MG per tablet   Commonly known as: NORCO   Take 1 tablet by mouth every 8 (eight) hours as needed. For pain      oxyCODONE-acetaminophen 5-325 MG per tablet   Commonly known as: PERCOCET   Take 1-2 tablets by mouth every 4 (four) hours as needed.      rivaroxaban 10 MG Tabs tablet   Commonly known as: XARELTO   Take 1 tablet (10 mg total) by mouth daily.            Diagnostic Studies: Dg Chest 2 View  02/08/2011  *RADIOLOGY REPORT*  Clinical Data: Preop  CHEST - 2 VIEW  Comparison: 02/25/2010  Findings: Lungs are clear. No pleural effusion or pneumothorax.  Cardiomediastinal silhouette is within normal limits.  Visualized osseous structures are within normal limits.  IMPRESSION: No evidence of acute cardiopulmonary disease.  Original Report Authenticated By: Charline Bills, M.D.   Dg Lumbar  Spine 2-3 Views  02/12/2011  *RADIOLOGY REPORT*  Clinical Data: Back pain.  History of anterior lumbar fusion.  LUMBAR SPINE - 2-3 VIEW  Comparison: 02/11/2011.  Findings: Anterior plate and screws and interbody bone spacer at L5- S1 appears stable.  No complicating features. Normal alignment of the lumbar vertebral bodies.  A diffuse ileus appearing bowel gas pattern is noted.  IMPRESSION: Stable position and appearance of the anterior fusion hardware and interbody bone spacer at L5-S1.  Original Report Authenticated By: P. Loralie Champagne, M.D.   Dg Lumbar Spine 2-3 Views  02/11/2011  *RADIOLOGY REPORT*  Clinical Data: L5 - S1 A L I F  LUMBAR SPINE - 2-3 VIEW  Comparison: February 08, 2011  Findings: There is anterior fusion of L5-L1 with normal alignment and  preservation of the disc space.  IMPRESSION: Normal alignment after L5-S1 fusion.  Original Report Authenticated By: Brandon Melnick, M.D.   Dg Lumbar Spine 2-3 Views  02/08/2011  *RADIOLOGY REPORT*  Clinical Data: L5 S1 fusion.  LUMBAR SPINE - 2-3 VIEW  Comparison: 03/04/2010  Findings: AP and lateral views are performed of the lumbosacral spine.  By history, there has been fusion at L5-S1.  The patient is slightly rotated.  The patient is slightly rotated.  No evidence for acute fracture or subluxation.  Regional bowel gas pattern is nonobstructive.  IMPRESSION:  Normal alignment of the lumbosacral spine.  Original Report Authenticated By: Patterson Hammersmith, M.D.   Dg Or Local Abdomen  02/11/2011  *RADIOLOGY REPORT*  Clinical Data: Final instrument count  OR LOCAL ABDOMEN  Comparison: None.  Findings: Single frontal view of the lumbar spine submitted. Metallic fixation plate and screws noted at L5-S1 level.  No radiopaque instruments are identified.  IMPRESSION: Metallic fixation plate and screws noted at L5-S1 level.  No radiopaque instruments are identified.  Original Report Authenticated By: Natasha Mead, M.D.    Disposition: Home or Self Care  Discharge Orders    Future Orders Please Complete By Expires   Diet - low sodium heart healthy      Diet - low sodium heart healthy      Call MD / Call 911      Comments:   If you experience chest pain or shortness of breath, CALL 911 and be transported to the hospital emergency room.  If you develope a fever above 101 F, pus (white drainage) or increased drainage or redness at the wound, or calf pain, call your surgeon's office.   Constipation Prevention      Comments:   Drink plenty of fluids.  Prune juice may be helpful.  You may use a stool softener, such as Colace (over the counter) 100 mg twice a day.  Use MiraLax (over the counter) for constipation as needed.   Increase activity slowly as tolerated      Discharge instructions      Comments:    Walk As Tolerated utilizing back precautions.  No bending, twisting, or lifting.  No driving for 2 weeks.  Ok to shower in 72 hours.  Use good body mechanics. Change dressing daily. Use brace when out of bed.   Call MD / Call 911      Comments:   If you experience chest pain or shortness of breath, CALL 911 and be transported to the hospital emergency room.  If you develope a fever above 101 F, pus (white drainage) or increased drainage or redness at the wound, or calf pain, call your surgeon's office.  Constipation Prevention      Comments:   Drink plenty of fluids.  Prune juice may be helpful.  You may use a stool softener, such as Colace (over the counter) 100 mg twice a day.  Use MiraLax (over the counter) for constipation as needed.   Increase activity slowly as tolerated      Weight Bearing as taught in Physical Therapy      Comments:   Use a walker or crutches as instructed.   Discharge instructions      Comments:   Instructions per Dr Shelle Iron.  May shower by 3 days after surgery if no wound drainage.  Rx percocet.      Follow-up Information    Follow up with BEANE,JEFFREY C, MD in 2 weeks.   Contact information:   [facility] [address] [place] [address] [phone]           Signed: Liam Graham. 02/15/2011, 8:24 AM

## 2011-02-16 ENCOUNTER — Encounter (HOSPITAL_COMMUNITY): Payer: Self-pay | Admitting: Specialist

## 2011-05-10 DIAGNOSIS — H6120 Impacted cerumen, unspecified ear: Secondary | ICD-10-CM | POA: Insufficient documentation

## 2011-05-10 DIAGNOSIS — D179 Benign lipomatous neoplasm, unspecified: Secondary | ICD-10-CM | POA: Insufficient documentation

## 2016-06-11 ENCOUNTER — Ambulatory Visit: Payer: Self-pay | Admitting: Family Medicine

## 2016-09-01 ENCOUNTER — Ambulatory Visit: Payer: Self-pay | Admitting: Physician Assistant

## 2016-10-19 ENCOUNTER — Ambulatory Visit: Payer: Self-pay | Admitting: Physician Assistant

## 2016-10-27 ENCOUNTER — Ambulatory Visit: Payer: Self-pay | Admitting: Physician Assistant

## 2017-03-10 ENCOUNTER — Encounter: Payer: Self-pay | Admitting: Internal Medicine

## 2017-03-10 ENCOUNTER — Ambulatory Visit: Payer: Worker's Compensation | Admitting: Physician Assistant

## 2017-03-10 ENCOUNTER — Encounter: Payer: Self-pay | Admitting: Physician Assistant

## 2017-03-10 VITALS — BP 118/74 | HR 68 | Ht 73.0 in | Wt 152.0 lb

## 2017-03-10 DIAGNOSIS — F17200 Nicotine dependence, unspecified, uncomplicated: Secondary | ICD-10-CM | POA: Insufficient documentation

## 2017-03-10 DIAGNOSIS — M542 Cervicalgia: Secondary | ICD-10-CM

## 2017-03-10 DIAGNOSIS — K1370 Unspecified lesions of oral mucosa: Secondary | ICD-10-CM | POA: Insufficient documentation

## 2017-03-10 DIAGNOSIS — Z125 Encounter for screening for malignant neoplasm of prostate: Secondary | ICD-10-CM | POA: Diagnosis not present

## 2017-03-10 DIAGNOSIS — Z87891 Personal history of nicotine dependence: Secondary | ICD-10-CM | POA: Insufficient documentation

## 2017-03-10 DIAGNOSIS — L723 Sebaceous cyst: Secondary | ICD-10-CM | POA: Insufficient documentation

## 2017-03-10 DIAGNOSIS — G8929 Other chronic pain: Secondary | ICD-10-CM | POA: Insufficient documentation

## 2017-03-10 DIAGNOSIS — F17210 Nicotine dependence, cigarettes, uncomplicated: Secondary | ICD-10-CM

## 2017-03-10 DIAGNOSIS — Z13220 Encounter for screening for lipoid disorders: Secondary | ICD-10-CM

## 2017-03-10 DIAGNOSIS — Z7689 Persons encountering health services in other specified circumstances: Secondary | ICD-10-CM

## 2017-03-10 DIAGNOSIS — Z130 Encounter for screening for diseases of the blood and blood-forming organs and certain disorders involving the immune mechanism: Secondary | ICD-10-CM

## 2017-03-10 DIAGNOSIS — Z1159 Encounter for screening for other viral diseases: Secondary | ICD-10-CM

## 2017-03-10 DIAGNOSIS — Z1211 Encounter for screening for malignant neoplasm of colon: Secondary | ICD-10-CM

## 2017-03-10 DIAGNOSIS — Z114 Encounter for screening for human immunodeficiency virus [HIV]: Secondary | ICD-10-CM

## 2017-03-10 DIAGNOSIS — Z9889 Other specified postprocedural states: Secondary | ICD-10-CM | POA: Insufficient documentation

## 2017-03-10 NOTE — Progress Notes (Signed)
HPI:                                                                Christopher Wells is a 52 y.o. male who presents to [facility] today to establish care  Current concerns: oral lesion, neck mass  Reports he noticed a lesion of his left upper oral cavity approx. 6 months while masticating. Reports food occasionally gets caught in that area. Denies tenderness, drainage or bleeding gums.   He also reports a nontender nodule on his right neck. States it occasionally drains foul, smelling, thick substance. Denies redness, warmth, tenderness.   Former smokeless tobacco user, last used age 25 Current every day smoker, 1 ppd approximately 20 packyears Tobacco: longest quit attempt 2 years, cold [place] method Attempted in 2018 with vaping, x 6 months   No flowsheet data found.  No flowsheet data found.    Past Medical History:  Diagnosis Date  . Arthritis    DDD- lumbar  . Heavy tobacco smoker   . Lumbar degenerative disc disease   . Recurrent upper respiratory infection (URI)    took z-pak, for URI- started 02/03/2011   Past Surgical History:  Procedure Laterality Date  . ANTERIOR LUMBAR FUSION  02/11/2011   Procedure: ANTERIOR LUMBAR FUSION 1 LEVEL;  Surgeon: Johnn Hai, MD;  Location: [facility];  Service: Orthopedics;  Laterality: N/A;  ALIVL5-S1  . BACK SURGERY     lumbar surgery- x3 prev. (first 2 in [place])   . LUMBAR DISC SURGERY     x 3   Social History   Tobacco Use  . Smoking status: Current Every Day Smoker    Packs/day: 1.00    Types: Cigarettes  . Smokeless tobacco: Former Network engineer Use Topics  . Alcohol use: No   family history includes Cancer in his brother; Dementia in his mother; Diabetes in his brother, brother, and mother; Heart disease in his father; Hyperlipidemia in his father; Hypertension in his father; Prostate cancer in his brother.    ROS: Review of Systems  Constitutional: Positive for  malaise/fatigue. Negative for chills, fever and weight loss.  HENT: Positive for ear pain and hearing loss.   Gastrointestinal: Positive for diarrhea.  Musculoskeletal: Positive for back pain and neck pain.  Skin:       + lipoma  Neurological: Positive for headaches. Negative for weakness.  Endo/Heme/Allergies: Positive for environmental allergies.     Medications: Current Outpatient Medications  Medication Sig Dispense Refill  . IBUPROFEN PO Take by mouth.     No current facility-administered medications for this visit.    No Known Allergies     Objective:  BP 118/74   Pulse 68   Ht 6\' 1"  (1.854 m)   Wt 152 lb (68.9 kg)   BMI 20.05 kg/m  Gen:  alert, not ill-appearing, no distress, appropriate for age 76: head normocephalic without obvious abnormality, conjunctiva and cornea clear, right buccal mucosa with purply-hued 18mm flat lesion, no luekoplakia or erythroplakia, there is a firm, pink, smooth surfaced nodule approx 54mm  of the left upper posterior oral mucosa, neck supple, no cervical adenopathy, trachea midline Pulm: Normal work of breathing, normal phonation Neuro: alert and oriented x 3, no tremor  MSK: extremities atraumatic, normal gait and station Skin: large lipoma of the posterior neck, just lateral to the lower cervical spinous process; sebaceous cyst of the right lateral neck; intact, no rashes on exposed skin, no jaundice, no cyanosis Psych: well-groomed, cooperative, good eye contact, euthymic mood, affect mood-congruent, speech is articulate, and thought processes clear and goal-directed    No results found for this or any previous visit (from the past 72 hour(s)). No results found.    Assessment and Plan: 52 y.o. male with  1. Encounter to establish care - reviewed PMH, PSH, PFH, medicaitons and allergies - reviewed health maintenance - overdue for colon cancer screening, order placed today - declines influenza and Tdap today  2. Colon cancer  screening - Ambulatory referral to Gastroenterology  3. Screening PSA (prostate specific antigen) - PSA  4. Encounter for hepatitis C screening test for low risk patient - Hepatitis C antibody  5. Encounter for screening for HIV - HIV antibody  6. Screening for blood disease - CBC with Differential/Platelet - Comprehensive metabolic panel  7. Screening for lipid disorders - Lipid Panel w/reflex Direct LDL  8. Chronic neck pain - DG Cervical Spine Complete  9. Cigarette nicotine dependence without complication - counseled on smoking cessation and provided with materials to develop a quit plan  10. 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 Sebaceous cyst - instructed to schedule follow-up appointment with Dr. Dianah Field for surgical excision   Patient had many concerns he wanted to address today and has not seen a provider in years. Recommended a follow-up appointment for 40 minutes in 1-3 weeks to address these concerns in detail.  Patient education and anticipatory guidance given Patient agrees with treatment plan   Darlyne Russian PA-C

## 2017-03-10 NOTE — Patient Instructions (Signed)
Keep follow-up appointment with your dentist Read through smoking cessation materials and develop a quit plan See me for help with smoking cessation (medications like Wellbutrin or Chantix or nicotine replacement)

## 2017-03-17 ENCOUNTER — Ambulatory Visit: Payer: BLUE CROSS/BLUE SHIELD | Admitting: Sports Medicine

## 2017-03-17 ENCOUNTER — Encounter: Payer: Self-pay | Admitting: Sports Medicine

## 2017-03-17 DIAGNOSIS — D170 Benign lipomatous neoplasm of skin and subcutaneous tissue of head, face and neck: Secondary | ICD-10-CM | POA: Diagnosis not present

## 2017-03-17 MED ORDER — HYDROCODONE-ACETAMINOPHEN 5-325 MG PO TABS: 1.0000 | ORAL_TABLET | Freq: Three times a day (TID) | ORAL | 0 refills | Status: DC | PRN

## 2017-03-17 NOTE — Patient Instructions (Signed)
Incision Care, Adult An incision is a surgical cut that is made through your skin. Most incisions are closed after surgery. Your incision may be closed with stitches (sutures), staples, skin glue, or adhesive strips. You may need to return to your health care provider to have sutures or staples removed. This may occur several days to several weeks after your surgery. The incision needs to be cared for properly to prevent infection. How to care for your incision Incision care   Follow instructions from your health care provider about how to take care of your incision. Make sure you: ? Wash your hands with soap and water before you change the bandage (dressing). If soap and water are not available, use hand sanitizer. ? Change your dressing as told by your health care provider. ? Leave sutures, skin glue, or adhesive strips in place. These skin closures may need to stay in place for 2 weeks or longer. If adhesive strip edges start to loosen and curl up, you may trim the loose edges. Do not remove adhesive strips completely unless your health care provider tells you to do that.  Check your incision area every day for signs of infection. Check for: ? More redness, swelling, or pain. ? More fluid or blood. ? Warmth. ? Pus or a bad smell.  Ask your health care provider how to clean the incision. This may include: ? Using mild soap and water. ? Using a clean towel to pat the incision dry after cleaning it. ? Applying a cream or ointment. Do this only as told by your health care provider. ? Covering the incision with a clean dressing.  Ask your health care provider when you can leave the incision uncovered.  Do not take baths, swim, or use a hot tub until your health care provider approves. Ask your health care provider if you can take showers. You may only be allowed to take sponge baths for bathing. Medicines  If you were prescribed an antibiotic medicine, cream, or ointment, take or apply the  antibiotic as told by your health care provider. Do not stop taking or applying the antibiotic even if your condition improves.  Take over-the-counter and prescription medicines only as told by your health care provider. General instructions  Limit movement around your incision to improve healing. ? Avoid straining, lifting, or exercise for the first month, or for as long as told by your health care provider. ? Follow instructions from your health care provider about returning to your normal activities. ? Ask your health care provider what activities are safe.  Protect your incision from the sun when you are outside for the first 6 months, or for as long as told by your health care provider. Apply sunscreen around the scar or cover it up.  Keep all follow-up visits as told by your health care provider. This is important. Contact a health care provider if:  Your have more redness, swelling, or pain around the incision.  You have more fluid or blood coming from the incision.  Your incision feels warm to the touch.  You have pus or a bad smell coming from the incision.  You have a fever or shaking chills.  You are nauseous or you vomit.  You are dizzy.  Your sutures or staples come undone. Get help right away if:  You have a red streak coming from your incision.  Your incision bleeds through the dressing and the bleeding does not stop with gentle pressure.  The edges of   your incision open up and separate.  You have severe pain.  You have a rash.  You are confused.  You faint.  You have trouble breathing and a fast heartbeat. This information is not intended to replace advice given to you by your health care provider. Make sure you discuss any questions you have with your health care provider. Document Released: 07/10/2004 Document Revised: 08/29/2015 Document Reviewed: 07/09/2015 Elsevier Interactive Patient Education  2018 [employer]  

## 2017-03-17 NOTE — Progress Notes (Signed)
   Procedure:  Excision of neck sebaceous cyst, 4.5 cm Risks, benefits, and alternatives explained and consent obtained. Time out conducted. Surface prepped with alcohol. 10cc lidocaine with epinephine infiltrated in a field block. Adequate anesthesia ensured. Area prepped and draped in a sterile fashion. Excision performed with: Using a #15 blade I made an elliptical incision around the cyst, I expressed the contents and then using both sharp and blunt dissection I carried my procedure around the wall of the cyst and removed it en bloc.  I then closed the incision with #5, 3-0 simple interrupted Ethilon sutures. Hemostasis achieved. Pt stable.

## 2017-03-17 NOTE — Assessment & Plan Note (Addendum)
Surgical excision as above. Avoid smoking for the next 2 weeks, hydrocodone for postoperative pain. Aftercare advised. Return in 2 weeks for suture removal.

## 2017-03-21 ENCOUNTER — Encounter: Payer: Self-pay | Admitting: Physician Assistant

## 2017-03-28 ENCOUNTER — Ambulatory Visit: Payer: Self-pay | Admitting: Physician Assistant

## 2017-03-31 ENCOUNTER — Ambulatory Visit (INDEPENDENT_AMBULATORY_CARE_PROVIDER_SITE_OTHER): Payer: BLUE CROSS/BLUE SHIELD | Admitting: Sports Medicine

## 2017-03-31 ENCOUNTER — Encounter: Payer: Self-pay | Admitting: Sports Medicine

## 2017-03-31 DIAGNOSIS — L723 Sebaceous cyst: Secondary | ICD-10-CM

## 2017-03-31 NOTE — Progress Notes (Signed)
  Subjective: 2 weeks post excision of a large sebaceous cyst on the neck, doing well.  Some itching at the incision site, no pain.  Objective: General: Well-developed, well-nourished, and in no acute distress. Incision: Clean, dry, intact, sutures are removed, incision was debrided of devitalized tissue and Dermabond applied.  Assessment/plan:   Sebaceous cyst Clean, dry, intact, sutures are removed, incision was debrided of devitalized tissue and Dermabond applied. Overall fantastic result, return as needed. ___________________________________________ Gwen Her. Dianah Field, M.D., ABFM., CAQSM. Primary Care and [place] In[employer] of [employer] [facility]

## 2017-03-31 NOTE — Assessment & Plan Note (Signed)
Clean, dry, intact, sutures are removed, incision was debrided of devitalized tissue and Dermabond applied. Overall fantastic result, return as needed.

## 2017-04-08 ENCOUNTER — Ambulatory Visit: Payer: Self-pay | Admitting: Physician Assistant

## 2017-04-08 DIAGNOSIS — Z0189 Encounter for other specified special examinations: Secondary | ICD-10-CM

## 2017-05-06 ENCOUNTER — Telehealth: Payer: Self-pay

## 2017-05-06 NOTE — Telephone Encounter (Signed)
Follow up call made in attempts to reschedule previsit from yesterday. Pt was a no show pv on 5-2.

## 2017-05-19 ENCOUNTER — Encounter: Payer: Self-pay | Admitting: Internal Medicine

## 2018-08-22 ENCOUNTER — Ambulatory Visit (INDEPENDENT_AMBULATORY_CARE_PROVIDER_SITE_OTHER): Payer: BC Managed Care – PPO

## 2018-08-22 ENCOUNTER — Ambulatory Visit (INDEPENDENT_AMBULATORY_CARE_PROVIDER_SITE_OTHER): Payer: BC Managed Care – PPO | Admitting: Physician Assistant

## 2018-08-22 ENCOUNTER — Encounter: Payer: Self-pay | Admitting: Physician Assistant

## 2018-08-22 ENCOUNTER — Other Ambulatory Visit: Payer: Self-pay

## 2018-08-22 VITALS — BP 116/71 | HR 69 | Temp 98.4°F | Wt 150.0 lb

## 2018-08-22 DIAGNOSIS — Z131 Encounter for screening for diabetes mellitus: Secondary | ICD-10-CM

## 2018-08-22 DIAGNOSIS — N529 Male erectile dysfunction, unspecified: Secondary | ICD-10-CM

## 2018-08-22 DIAGNOSIS — Z0001 Encounter for general adult medical examination with abnormal findings: Secondary | ICD-10-CM

## 2018-08-22 DIAGNOSIS — R05 Cough: Secondary | ICD-10-CM

## 2018-08-22 DIAGNOSIS — Z1159 Encounter for screening for other viral diseases: Secondary | ICD-10-CM | POA: Diagnosis not present

## 2018-08-22 DIAGNOSIS — Z1211 Encounter for screening for malignant neoplasm of colon: Secondary | ICD-10-CM

## 2018-08-22 DIAGNOSIS — Z1389 Encounter for screening for other disorder: Secondary | ICD-10-CM

## 2018-08-22 DIAGNOSIS — J449 Chronic obstructive pulmonary disease, unspecified: Secondary | ICD-10-CM

## 2018-08-22 DIAGNOSIS — Z0000 Encounter for general adult medical examination without abnormal findings: Secondary | ICD-10-CM

## 2018-08-22 DIAGNOSIS — F17210 Nicotine dependence, cigarettes, uncomplicated: Secondary | ICD-10-CM | POA: Diagnosis not present

## 2018-08-22 DIAGNOSIS — R058 Other specified cough: Secondary | ICD-10-CM

## 2018-08-22 DIAGNOSIS — Z23 Encounter for immunization: Secondary | ICD-10-CM | POA: Diagnosis not present

## 2018-08-22 DIAGNOSIS — F17200 Nicotine dependence, unspecified, uncomplicated: Secondary | ICD-10-CM

## 2018-08-22 DIAGNOSIS — Z13220 Encounter for screening for lipoid disorders: Secondary | ICD-10-CM

## 2018-08-22 DIAGNOSIS — Z130 Encounter for screening for diseases of the blood and blood-forming organs and certain disorders involving the immune mechanism: Secondary | ICD-10-CM

## 2018-08-22 MED ORDER — ALBUTEROL SULFATE HFA 108 (90 BASE) MCG/ACT IN AERS: 1.0000 | INHALATION_SPRAY | RESPIRATORY_TRACT | 0 refills | Status: DC | PRN

## 2018-08-22 MED ORDER — SILDENAFIL CITRATE 20 MG PO TABS: 20.0000 mg | ORAL_TABLET | ORAL | 3 refills | Status: DC | PRN

## 2018-08-22 NOTE — Patient Instructions (Addendum)
Erectile Dysfunction Erectile dysfunction (ED) is the inability to get or keep an erection in order to have sexual intercourse. Erectile dysfunction may include:  Inability to get an erection.  Lack of enough hardness of the erection to allow penetration.  Loss of the erection before sex is finished. What are the causes? This condition may be caused by:  Certain medicines, such as: ? Pain relievers. ? Antihistamines. ? Antidepressants. ? Blood pressure medicines. ? Water pills (diuretics). ? Ulcer medicines. ? Muscle relaxants. ? Drugs.  Excessive drinking.  Psychological causes, such as: ? Anxiety. ? Depression. ? Sadness. ? Exhaustion. ? Performance fear. ? Stress.  Physical causes, such as: ? Artery problems. This may include diabetes, smoking, liver disease, or atherosclerosis. ? High blood pressure. ? Hormonal problems, such as low testosterone. ? Obesity. ? Nerve problems. This may include back or pelvic injuries, diabetes mellitus, multiple sclerosis, or Parkinson disease. What are the signs or symptoms? Symptoms of this condition include:  Inability to get an erection.  Lack of enough hardness of the erection to allow penetration.  Loss of the erection before sex is finished.  Normal erections at some times, but with frequent unsatisfactory episodes.  Low sexual satisfaction in either partner due to erection problems.  A curved penis occurring with erection. The curve may cause pain or the penis may be too curved to allow for intercourse.  Never having nighttime erections. How is this diagnosed? This condition is often diagnosed by:  Performing a physical exam to find other diseases or specific problems with the penis.  Asking you detailed questions about the problem.  Performing blood tests to check for diabetes mellitus or to measure hormone levels.  Performing other tests to check for underlying health conditions.  Performing an ultrasound  exam to check for scarring.  Performing a test to check blood flow to the penis.  Doing a sleep study at home to measure nighttime erections. How is this treated? This condition may be treated by:  Medicine taken by mouth to help you achieve an erection (oral medicine).  Hormone replacement therapy to replace low testosterone levels.  Medicine that is injected into the penis. Your health care provider may instruct you how to give yourself these injections at home.  Vacuum pump. This is a pump with a ring on it. The pump and ring are placed on the penis and used to create pressure that helps the penis become erect.  Penile implant surgery. In this procedure, you may receive: ? An inflatable implant. This consists of cylinders, a pump, and a reservoir. The cylinders can be inflated with a fluid that helps to create an erection, and they can be deflated after intercourse. ? A semi-rigid implant. This consists of two silicone rubber rods. The rods provide some rigidity. They are also flexible, so the penis can both curve downward in its normal position and become straight for sexual intercourse.  Blood vessel surgery, to improve blood flow to the penis. During this procedure, a blood vessel from a different part of the body is placed into the penis to allow blood to flow around (bypass) damaged or blocked blood vessels.  Lifestyle changes, such as exercising more, losing weight, and quitting smoking. Follow these instructions at home: Medicines   Take over-the-counter and prescription medicines only as told by your health care provider. Do not increase the dosage without first discussing it with your health care provider.  If you are using self-injections, perform injections as directed by your  health care provider. Make sure to avoid any veins that are on the surface of the penis. After giving an injection, apply pressure to the injection site for 5 minutes. General instructions   Exercise regularly, as directed by your health care provider. Work with your health care provider to lose weight, if needed.  Do not use any products that contain nicotine or tobacco, such as cigarettes and e-cigarettes. If you need help quitting, ask your health care provider.  Before using a vacuum pump, read the instructions that come with the pump and discuss any questions with your health care provider.  Keep all follow-up visits as told by your health care provider. This is important. Contact a health care provider if:  You feel nauseous.  You vomit. Get help right away if:  You are taking oral or injectable medicines and you have an erection that lasts longer than 4 hours. If your health care provider is unavailable, go to the nearest emergency room for evaluation. An erection that lasts much longer than 4 hours can result in permanent damage to your penis.  You have severe pain in your groin or abdomen.  You develop redness or severe swelling of your penis.  You have redness spreading up into your groin or lower abdomen.  You are unable to urinate.  You experience chest pain or a rapid heart beat (palpitations) after taking oral medicines. Summary  Erectile dysfunction (ED) is the inability to get or keep an erection during sexual intercourse. This problem can usually be treated successfully.  This condition is diagnosed based on a physical exam, your symptoms, and tests to determine the cause. Treatment varies depending on the cause, and may include medicines, hormone therapy, surgery, or vacuum pump.  You may need follow-up visits to make sure that you are using your medicines or devices correctly.  Get help right away if you are taking or injecting medicines and you have an erection that lasts longer than 4 hours. This information is not intended to replace advice given to you by your health care provider. Make sure you discuss any questions you have with your health care  provider. Document Released: 12/19/1999 Document Revised: 12/03/2016 Document Reviewed: 01/07/2016 Elsevier Patient Education  2020 [employer].   Chronic Obstructive Pulmonary Disease  Chronic obstructive pulmonary disease (COPD) is a long-term (chronic) condition that affects the lungs. COPD is a general term that can be used to describe many different lung problems that cause lung swelling (inflammation) and limit airflow, including chronic bronchitis and emphysema. If you have COPD, your lung function will probably never return to normal. In most cases, it gets worse over time. However, there are steps you can take to slow the progression of the disease and improve your quality of life. What are the causes? This condition may be caused by:  Smoking. This is the most common cause.  Certain genes passed down through families. What increases the risk? The following factors may make you more likely to develop this condition:  Secondhand smoke from cigarettes, pipes, or cigars.  Exposure to chemicals and other irritants such as fumes and dust in the work environment.  Chronic lung conditions or infections. What are the signs or symptoms? Symptoms of this condition include:  Shortness of breath, especially during physical activity.  Chronic cough with a large amount of thick mucus. Sometimes the cough may not have any mucus (dry cough).  Wheezing.  Rapid breaths.  Pearline Cables or bluish discoloration (cyanosis) of the skin, especially  in your fingers, toes, or lips.  Feeling tired (fatigue).  Weight loss.  Chest tightness.  Frequent infections.  Episodes when breathing symptoms become much worse (exacerbations).  Swelling in the ankles, feet, or legs. This may occur in later stages of the disease. How is this diagnosed? This condition is diagnosed based on:  Your medical history.  A physical exam. You may also have tests, including:  Lung (pulmonary) function tests. This  may include a spirometry test, which measures your ability to exhale properly.  Chest X-ray.  CT scan.  Blood tests. How is this treated? This condition may be treated with:  Medicines. These may include inhaled rescue medicines to treat acute exacerbations as well as long-term, or maintenance, medicines to prevent flare-ups of COPD. ? Bronchodilators help treat COPD by dilating the airways to allow increased airflow and make your breathing more comfortable. ? Steroids can reduce airway inflammation and help prevent exacerbations.  Smoking cessation. If you smoke, your health care provider may ask you to quit, and may also recommend therapy or replacement products to help you quit.  Pulmonary rehabilitation. This may involve working with a team of health care providers and specialists, such as respiratory, occupational, and physical therapists.  Exercise and physical activity. These are beneficial for nearly all people with COPD.  Nutrition therapy to gain weight, if you are underweight.  Oxygen. Supplemental oxygen therapy is only helpful if you have a low oxygen level in your blood (hypoxemia).  Lung surgery or transplant.  Palliative care. This is to help people with COPD feel comfortable when treatment is no longer working. Follow these instructions at home: Medicines  Take over-the-counter and prescription medicines (inhaled or pills) only as told by your health care provider.  Talk to your health care provider before taking any cough or allergy medicines. You may need to avoid certain medicines that dry out your airways. Lifestyle  If you are a smoker, the most important thing that you can do is to stop smoking. Do not use any products that contain nicotine or tobacco, such as cigarettes and e-cigarettes. If you need help quitting, ask your health care provider. Continuing to smoke will cause the disease to progress faster.  Avoid exposure to things that irritate your  lungs, such as smoke, chemicals, and fumes.  Stay active, but balance activity with periods of rest. Exercise and physical activity will help you maintain your ability to do things you want to do.  Learn and use relaxation techniques to manage stress and to control your breathing.  Get the right amount of sleep and get quality sleep. Most adults need 7 or more hours per night.  Eat healthy foods. Eating smaller, more frequent meals and resting before meals may help you maintain your strength. Controlled breathing Learn and use controlled breathing techniques as directed by your health care provider. Controlled breathing techniques include:  Pursed lip breathing. Start by breathing in (inhaling) through your nose for 1 second. Then, purse your lips as if you were going to whistle and breathe out (exhale) through the pursed lips for 2 seconds.  Diaphragmatic breathing. Start by putting one hand on your abdomen just above your waist. Inhale slowly through your nose. The hand on your abdomen should move out. Then purse your lips and exhale slowly. You should be able to feel the hand on your abdomen moving in as you exhale. Controlled coughing Learn and use controlled coughing to clear mucus from your lungs. Controlled coughing is a series  of short, progressive coughs. The steps of controlled coughing are: 1. Lean your head slightly forward. 2. Breathe in deeply using diaphragmatic breathing. 3. Try to hold your breath for 3 seconds. 4. Keep your mouth slightly open while coughing twice. 5. Spit any mucus out into a tissue. 6. Rest and repeat the steps once or twice as needed. General instructions  Make sure you receive all the vaccines that your health care provider recommends, especially the pneumococcal and influenza vaccines. Preventing infection and hospitalization is very important when you have COPD.  Use oxygen therapy and pulmonary rehabilitation if directed to by your health care  provider. If you require home oxygen therapy, ask your health care provider whether you should purchase a pulse oximeter to measure your oxygen level at home.  Work with your health care provider to develop a COPD action plan. This will help you know what steps to take if your condition gets worse.  Keep other chronic health conditions under control as told by your health care provider.  Avoid extreme temperature and humidity changes.  Avoid contact with people who have an illness that spreads from person to person (is contagious), such as viral infections or pneumonia.  Keep all follow-up visits as told by your health care provider. This is important. Contact a health care provider if:  You are coughing up more mucus than usual.  There is a change in the color or thickness of your mucus.  Your breathing is more labored than usual.  Your breathing is faster than usual.  You have difficulty sleeping.  You need to use your rescue medicines or inhalers more often than expected.  You have trouble doing routine activities such as getting dressed or walking around the house. Get help right away if:  You have shortness of breath while you are resting.  You have shortness of breath that prevents you from: ? Being able to talk. ? Performing your usual physical activities.  You have chest pain lasting longer than 5 minutes.  Your skin color is more blue (cyanotic) than usual.  You measure low oxygen saturations for longer than 5 minutes with a pulse oximeter.  You have a fever.  You feel too tired to breathe normally. Summary  Chronic obstructive pulmonary disease (COPD) is a long-term (chronic) condition that affects the lungs.  Your lung function will probably never return to normal. In most cases, it gets worse over time. However, there are steps you can take to slow the progression of the disease and improve your quality of life.  Treatment for COPD may include taking  medicines, quitting smoking, pulmonary rehabilitation, and changes to diet and exercise. As the disease progresses, you may need oxygen therapy, a lung transplant, or palliative care.  To help manage your condition, do not smoke, avoid exposure to things that irritate your lungs, stay up to date on all vaccines, and follow your health care provider's instructions for taking medicines. This information is not intended to replace advice given to you by your health care provider. Make sure you discuss any questions you have with your health care provider. Document Released: 09/30/2004 Document Revised: 12/03/2016 Document Reviewed: 01/26/2016 Elsevier Patient Education  2020 [employer]   Preventive Care 54-13 Years Old, Male Preventive care refers to lifestyle choices and visits with your health care provider that can promote health and wellness. This includes:  A yearly physical exam. This is also called an annual well check.  Regular dental and eye exams.  Immunizations.  Screening for certain conditions.  Healthy lifestyle choices, such as eating a healthy diet, getting regular exercise, not using drugs or products that contain nicotine and tobacco, and limiting alcohol use. What can I expect for my preventive care visit? Physical exam Your health care provider will check:  Height and weight. These may be used to calculate body mass index (BMI), which is a measurement that tells if you are at a healthy weight.  Heart rate and blood pressure.  Your skin for abnormal spots. Counseling Your health care provider may ask you questions about:  Alcohol, tobacco, and drug use.  Emotional well-being.  Home and relationship well-being.  Sexual activity.  Eating habits.  Work and work Statistician. What immunizations do I need?  Influenza (flu) vaccine  This is recommended every year. Tetanus, diphtheria, and pertussis (Tdap) vaccine  You may need a Td booster every 10  years. Varicella (chickenpox) vaccine  You may need this vaccine if you have not already been vaccinated. Zoster (shingles) vaccine  You may need this after age 79. Measles, mumps, and rubella (MMR) vaccine  You may need at least one dose of MMR if you were born in 1957 or later. You may also need a second dose. Pneumococcal conjugate (PCV13) vaccine  You may need this if you have certain conditions and were not previously vaccinated. Pneumococcal polysaccharide (PPSV23) vaccine  You may need one or two doses if you smoke cigarettes or if you have certain conditions. Meningococcal conjugate (MenACWY) vaccine  You may need this if you have certain conditions. Hepatitis A vaccine  You may need this if you have certain conditions or if you travel or work in places where you may be exposed to hepatitis A. Hepatitis B vaccine  You may need this if you have certain conditions or if you travel or work in places where you may be exposed to hepatitis B. Haemophilus influenzae type b (Hib) vaccine  You may need this if you have certain risk factors. Human papillomavirus (HPV) vaccine  If recommended by your health care provider, you may need three doses over 6 months. You may receive vaccines as individual doses or as more than one vaccine together in one shot (combination vaccines). Talk with your health care provider about the risks and benefits of combination vaccines. What tests do I need? Blood tests  Lipid and cholesterol levels. These may be checked every 5 years, or more frequently if you are over 18 years old.  Hepatitis C test.  Hepatitis B test. Screening  Lung cancer screening. You may have this screening every year starting at age 11 if you have a 30-pack-year history of smoking and currently smoke or have quit within the past 15 years.  Prostate cancer screening. Recommendations will vary depending on your family history and other risks.  Colorectal cancer screening.  All adults should have this screening starting at age 62 and continuing until age 50. Your health care provider may recommend screening at age 16 if you are at increased risk. You will have tests every 1-10 years, depending on your results and the type of screening test.  Diabetes screening. This is done by checking your blood sugar (glucose) after you have not eaten for a while (fasting). You may have this done every 1-3 years.  Sexually transmitted disease (STD) testing. Follow these instructions at home: Eating and drinking  Eat a diet that includes fresh fruits and vegetables, whole grains, lean protein, and low-fat dairy products.  Take vitamin and  mineral supplements as recommended by your health care provider.  Do not drink alcohol if your health care provider tells you not to drink.  If you drink alcohol: ? Limit how much you have to 0-2 drinks a day. ? Be aware of how much alcohol is in your drink. In the [place] one drink equals one 12 oz bottle of beer (355 mL), one 5 oz glass of wine (148 mL), or one 1 oz glass of hard liquor (44 mL). Lifestyle  Take daily care of your teeth and gums.  Stay active. Exercise for at least 30 minutes on 5 or more days each week.  Do not use any products that contain nicotine or tobacco, such as cigarettes, e-cigarettes, and chewing tobacco. If you need help quitting, ask your health care provider.  If you are sexually active, practice safe sex. Use a condom or other form of protection to prevent STIs (sexually transmitted infections).  Talk with your health care provider about taking a low-dose aspirin every day starting at age 68. What's next?  Go to your health care provider once a year for a well check visit.  Ask your health care provider how often you should have your eyes and teeth checked.  Stay up to date on all vaccines. This information is not intended to replace advice given to you by your health care provider. Make sure you  discuss any questions you have with your health care provider. Document Released: 01/17/2015 Document Revised: 12/15/2017 Document Reviewed: 12/15/2017 Elsevier Patient Education  2020 [employer].

## 2018-08-22 NOTE — Progress Notes (Signed)
HPI:                                                                Christopher Wells is a 53 y.o. male who presents to [place]: [place] today for annual physical exam  Current Concerns include: cough   Reports he has been having erectile problems for a number of years, but gradually worsening. Reports this is starting to cause problems in his marriage Erectile Dysfunction This is a chronic problem. The current episode started more than 1 year ago. The problem has been gradually worsening since onset. The nature of his difficulty is achieving erection, maintaining erection and penetration. Non-physiologic factors contributing to erectile dysfunction are performance anxiety. He reports his erection duration to be less than 1 minute. Irritative symptoms do not include frequency, nocturia or urgency. Obstructive symptoms do not include a slower stream or a weak stream. Past treatments include nothing. Risk factors include no AM erections and tobacco use.    Current every day smoker, approx 35 packyears. Reports he has had a cough for a few months. Occasionally productive. Associated with frequent throat clearing. Triggered by working outside in the heat. Cough is worse in the morning. No associated wheezing/hemoptysis/blood-tinged sputum. He thinks he gets out of breath quicker than he used to and has noticed that wearing a mask makes breathing more difficult. He works two jobs where he is pretty active.  Denies fever, chills, nightsweats. Endorses fatigue, but he attributes this to working 7 days per week. He believes he has lost weight, belt is looser.   Depression screen [facility] 2/9 08/22/2018 03/10/2017  Decreased Interest 0 1  Down, Depressed, Hopeless 0 0  PHQ - 2 Score 0 1    Health Maintenance Health Maintenance  Topic Date Due  . HIV Screening  09/24/1978  . COLONOSCOPY  09/23/2013  . INFLUENZA VACCINE  04/04/2019 (Originally 08/05/2018)  .  TETANUS/TDAP  08/21/2028  . Hepatitis C Screening  Completed     Past Medical History:  Diagnosis Date  . Arthritis    DDD- lumbar  . Heavy tobacco smoker   . Lumbar degenerative disc disease   . Recurrent upper respiratory infection (URI)    took z-pak, for URI- started 02/03/2011   Past Surgical History:  Procedure Laterality Date  . ANTERIOR LUMBAR FUSION  02/11/2011   Procedure: ANTERIOR LUMBAR FUSION 1 LEVEL;  Surgeon: Johnn Hai, MD;  Location: [facility];  Service: Orthopedics;  Laterality: N/A;  ALIVL5-S1  . BACK SURGERY     lumbar surgery- x3 prev. (first 2 in [place])   . LUMBAR DISC SURGERY     x 3   Social History   Tobacco Use  . Smoking status: Current Every Day Smoker    Packs/day: 1.00    Years: 35.00    Pack years: 35.00    Types: Cigarettes  . Smokeless tobacco: Former Network engineer Use Topics  . Alcohol use: Not Currently    Comment: socially   family history includes Cancer in his brother; Dementia in his mother; Diabetes in his brother, brother, and mother; Heart disease in his father; Hyperlipidemia in his father; Hypertension in his father; Prostate cancer in his brother.  ROS: negative except as noted  in the HPI  Medications: Current Outpatient Medications  Medication Sig Dispense Refill  . cetirizine (ZYRTEC) 10 MG tablet Take 10 mg by mouth daily.    . IBUPROFEN PO Take by mouth.    Marland Kitchen albuterol (VENTOLIN HFA) 108 (90 Base) MCG/ACT inhaler Inhale 1-2 puffs into the lungs every 4 (four) hours as needed for shortness of breath (bronchospasm). 6.7 g 0  . sildenafil (REVATIO) 20 MG tablet Take 1-5 tablets (20-100 mg total) by mouth as needed (sexual activity). Max 100 mg/day 50 tablet 3   No current facility-administered medications for this visit.    No Known Allergies     Objective:  BP 116/71   Pulse 69   Temp 98.4 F (36.9 C) (Oral)   Wt 150 lb (68 kg)   SpO2 98%   BMI 19.79 kg/m   Wt Readings from Last 3 Encounters:  08/22/18  150 lb (68 kg)  03/31/17 148 lb (67.1 kg)  03/10/17 152 lb (68.9 kg)    General Appearance:  Alert, cooperative, no distress, appropriate for age                            Head:  Normocephalic, without obvious abnormality                             Eyes:  PERRL, EOM's intact, conjunctiva and cornea clear                             Ears:  TM pearly gray color and semitransparent, external ear canals normal, both ears                            Nose:  Nares symmetrical, mucosa pink                          Throat:  Lips, tongue, and mucosa are moist, pink, and intact; oropharynx clear, uvula midline; good dentition                             Neck:  Supple; symmetrical, trachea midline, no adenopathy; thyroid: no enlargement, symmetric, no tenderness/mass/nodules; no carotid bruit                             Back:  Symmetrical, no curvature, ROM normal                                        Lungs:  Clear to auscultation bilaterally, respirations unlabored                             Heart:  normal rate & regular rhythm, S1 and S2 normal, no murmurs, rubs, or gallops                     Abdomen:  Soft, non-tender, no mass or organomegaly              Genitourinary:  deferred         Musculoskeletal:  Tone and strength strong and  symmetrical, all extremities; no joint pain or edema, normal gait and station                                     Lymphatic:  No adenopathy             Skin/Hair/Nails:  Skin warm, dry and intact, no rashes or abnormal dyspigmentation on limited exam                   Neurologic:  Alert and oriented x3, no cranial nerve deficits, sensation grossly intact, normal gait and station, no tremor Psych: well-groomed, cooperative, good eye contact, euthymic mood, affect mood-congruent, speech is articulate, and thought processes clear and goal-directed  Assessment and Plan: 53 y.o. male with   .Christopher Wells was seen today for annual exam.  Diagnoses and all orders for this  visit:  Nonproductive cough -     DG Chest 2 View  Tobacco use disorder  Colon cancer screening -     Ambulatory referral to Gastroenterology  COPD suggested by initial evaluation ([place]) -     albuterol (VENTOLIN HFA) 108 (90 Base) MCG/ACT inhaler; Inhale 1-2 puffs into the lungs every 4 (four) hours as needed for shortness of breath (bronchospasm).  Encounter for annual physical exam -     Hepatitis C antibody -     CBC -     COMPLETE METABOLIC PANEL WITH GFR -     Lipid Panel w/reflex Direct LDL -     Urinalysis, Routine w reflex microscopic  Encounter for hepatitis C screening test for low risk patient -     Hepatitis C antibody  Screening for lipid disorders -     Lipid Panel w/reflex Direct LDL  Screening for diabetes mellitus -     COMPLETE METABOLIC PANEL WITH GFR  Screening for blood disease -     CBC  Screening for blood or protein in urine -     Urinalysis, Routine w reflex microscopic  Need for Tdap vaccination -     Tdap vaccine greater than or equal to 7yo IM  Need for 23-polyvalent pneumococcal polysaccharide vaccine -     Pneumococcal polysaccharide vaccine 23-valent greater than or equal to 2yo subcutaneous/IM  Erectile dysfunction, unspecified erectile dysfunction type -     sildenafil (REVATIO) 20 MG tablet; Take 1-5 tablets (20-100 mg total) by mouth as needed (sexual activity). Max 100 mg/day   - Personally reviewed PMH, PSH, PFH, medications, allergies, HM - Age-appropriate cancer screening: referral to GI for colonoscopy, start low-dose lung CT for lung cancer screen age 55 - Pneumovax and Tdap given today - PHQ2 negative - Routine fasting labs pending - Declines smoking cessation  Chronic cough Pulse ox 98% on RA at rest, no adventitious lung sounds Likely smoker's cough Chest x-ray pending Spirometry  Consider CT chest if symptoms do not improve with trial of Albuterol  Patient education and anticipatory guidance given Patient agrees  with treatment plan Follow-up in 3 weeks for spirometry or sooner as needed  Darlyne Russian PA-C

## 2018-08-23 LAB — COMPLETE METABOLIC PANEL WITH GFR
AG Ratio: 1.8 (calc) (ref 1.0–2.5)
ALT: 24 U/L (ref 9–46)
AST: 16 U/L (ref 10–35)
Albumin: 4.4 g/dL (ref 3.6–5.1)
Alkaline phosphatase (APISO): 45 U/L (ref 35–144)
BUN: 18 mg/dL (ref 7–25)
CO2: 27 mmol/L (ref 20–32)
Calcium: 10.2 mg/dL (ref 8.6–10.3)
Chloride: 102 mmol/L (ref 98–110)
Creat: 1.23 mg/dL (ref 0.70–1.33)
GFR, Est African American: 77 mL/min/{1.73_m2} (ref >=60)
GFR, Est Non African American: 66 mL/min/{1.73_m2} (ref >=60)
Globulin: 2.4 g/dL (calc) (ref 1.9–3.7)
Glucose, Bld: 87 mg/dL (ref 65–99)
Potassium: 4.2 mmol/L (ref 3.5–5.3)
Sodium: 137 mmol/L (ref 135–146)
Total Bilirubin: 0.6 mg/dL (ref 0.2–1.2)
Total Protein: 6.8 g/dL (ref 6.1–8.1)

## 2018-08-23 LAB — CBC
HCT: 44.6 % (ref 38.5–50.0)
Hemoglobin: 15.4 g/dL (ref 13.2–17.1)
MCH: 30 pg (ref 27.0–33.0)
MCHC: 34.5 g/dL (ref 32.0–36.0)
MCV: 86.8 fL (ref 80.0–100.0)
MPV: 10.4 fL (ref 7.5–12.5)
Platelets: 298 10*3/uL (ref 140–400)
RBC: 5.14 10*6/uL (ref 4.20–5.80)
RDW: 12.1 % (ref 11.0–15.0)
WBC: 9.5 10*3/uL (ref 3.8–10.8)

## 2018-08-23 LAB — URINALYSIS, ROUTINE W REFLEX MICROSCOPIC
Bilirubin Urine: NEGATIVE
Glucose, UA: NEGATIVE
Hgb urine dipstick: NEGATIVE
Ketones, ur: NEGATIVE
Leukocytes,Ua: NEGATIVE
Nitrite: NEGATIVE
Protein, ur: NEGATIVE
Specific Gravity, Urine: 1.018 (ref 1.001–1.03)
pH: 7 (ref 5.0–8.0)

## 2018-08-23 LAB — LIPID PANEL W/REFLEX DIRECT LDL
Cholesterol: 179 mg/dL (ref <200)
HDL: 54 mg/dL (ref >=40)
LDL Cholesterol (Calc): 100 mg/dL (calc) — ABNORMAL HIGH
Non-HDL Cholesterol (Calc): 125 mg/dL (calc) (ref <130)
Total CHOL/HDL Ratio: 3.3 (calc) (ref <5.0)
Triglycerides: 145 mg/dL (ref <150)

## 2018-08-23 LAB — HEPATITIS C ANTIBODY
Hepatitis C Ab: NONREACTIVE
SIGNAL TO CUT-OFF: 0.01 (ref <1.00)

## 2018-09-06 ENCOUNTER — Telehealth: Payer: Self-pay

## 2018-09-06 ENCOUNTER — Other Ambulatory Visit: Payer: Self-pay

## 2018-09-06 ENCOUNTER — Ambulatory Visit (INDEPENDENT_AMBULATORY_CARE_PROVIDER_SITE_OTHER): Payer: BC Managed Care – PPO | Admitting: Sports Medicine

## 2018-09-06 ENCOUNTER — Encounter: Payer: Self-pay | Admitting: Sports Medicine

## 2018-09-06 DIAGNOSIS — B029 Zoster without complications: Secondary | ICD-10-CM | POA: Diagnosis not present

## 2018-09-06 MED ORDER — PREDNISONE 50 MG PO TABS: 50.0000 mg | ORAL_TABLET | Freq: Every day | ORAL | 0 refills | Status: DC

## 2018-09-06 MED ORDER — VALACYCLOVIR HCL 1 G PO TABS: 1000.0000 mg | ORAL_TABLET | Freq: Three times a day (TID) | ORAL | 2 refills | Status: DC

## 2018-09-06 NOTE — Progress Notes (Signed)
Subjective:    CC: Headache  HPI: For the past several days to a week he has had numbness on the left side of his head, above the eyebrow, mild headache.  He is also noted some swollen glands in his neck.  He does tell me he hit his head several weeks ago.  Never had any loss of consciousness.  No fevers, chills, he did have chickenpox as a child.  I reviewed the past medical history, family history, social history, surgical history, and allergies today and no changes were needed.  Please see the problem list section below in epic for further details.  Past Medical History: Past Medical History:  Diagnosis Date  . Arthritis    DDD- lumbar  . Heavy tobacco smoker   . Lumbar degenerative disc disease   . Recurrent upper respiratory infection (URI)    took z-pak, for URI- started 02/03/2011   Past Surgical History: Past Surgical History:  Procedure Laterality Date  . ANTERIOR LUMBAR FUSION  02/11/2011   Procedure: ANTERIOR LUMBAR FUSION 1 LEVEL;  Surgeon: Johnn Hai, MD;  Location: [place];  Service: Orthopedics;  Laterality: N/A;  ALIVL5-S1  . BACK SURGERY     lumbar surgery- x3 prev. (first 2 in [place])   . LUMBAR DISC SURGERY     x 3   Social History: Social History   Socioeconomic History  . Marital status: Married    Spouse name: Not on file  . Number of children: Not on file  . Years of education: Not on file  . Highest education level: Not on file  Occupational History  . Not on file  Social Needs  . Financial resource strain: Not on file  . Food insecurity    Worry: Not on file    Inability: Not on file  . Transportation needs    Medical: Not on file    Non-medical: Not on file  Tobacco Use  . Smoking status: Current Every Day Smoker    Packs/day: 1.00    Years: 35.00    Pack years: 35.00    Types: Cigarettes  . Smokeless tobacco: Former Network engineer and Sexual Activity  . Alcohol use: Not Currently    Comment: socially  . Drug use: Never  .  Sexual activity: Yes    Birth control/protection: None  Lifestyle  . Physical activity    Days per week: Not on file    Minutes per session: Not on file  . Stress: Not on file  Relationships  . Social Herbalist on phone: Not on file    Gets together: Not on file    Attends religious service: Not on file    Active member of club or organization: Not on file    Attends meetings of clubs or organizations: Not on file    Relationship status: Not on file  Other Topics Concern  . Not on file  Social History Narrative  . Not on file   Family History: Family History  Problem Relation Age of Onset  . Diabetes Mother   . Dementia Mother   . Hyperlipidemia Father   . Hypertension Father   . Heart disease Father   . Diabetes Brother   . Diabetes Brother   . Prostate cancer Brother   . Cancer Brother   . Anesthesia problems Neg Hx   . Hypotension Neg Hx   . Malignant hyperthermia Neg Hx   . Pseudochol deficiency Neg Hx  Allergies: No Known Allergies Medications: See med rec.  Review of Systems: No fevers, chills, night sweats, weight loss, chest pain, or shortness of breath.   Objective:    General: Well Developed, well nourished, and in no acute distress.  Neuro: Alert and oriented x3, extra-ocular muscles intact, sensation grossly intact.  Cranial nerves II through XII are grossly intact, motor, sensory, coordinative functions are all grossly intact. HEENT: Normocephalic, atraumatic, pupils equal round reactive to light, neck supple, no masses, no lymphadenopathy, thyroid nonpalpable.  There is a minimal erythematous punctate rash above his left eyebrow Skin: Warm and dry, no rashes. Cardiac: Regular rate and rhythm, no murmurs rubs or gallops, no lower extremity edema.  Respiratory: Clear to auscultation bilaterally. Not using accessory muscles, speaking in full sentences.  Impression and Recommendations:    Shingles Left cranial nerve V1 distribution.  Valtrex 3 times daily, prednisone. I do not see any involvement of his left cornea, he has a bit of redness in the lateral canthus of his left eye but he is asymptomatic. We will have a low threshold for getting consultation with optometry. Anticipatory guidance given. I do not think this has anything to do with hitting his head sometime ago. Once this is all resolved I think he would benefit from Shingrix vaccination.   ___________________________________________ Gwen Her. Dianah Field, M.D., ABFM., CAQSM. Primary Care and Sports Medicine [facility]  Adjunct Professor of [employer]

## 2018-09-06 NOTE — Telephone Encounter (Signed)
Christopher Wells called and states he is unable to get in with the dentist due to someone in the office is pregnant. He wanted to know if he could get some pain medication and antibiotic.

## 2018-09-06 NOTE — Telephone Encounter (Signed)
Yes, from the dentist, the dentist can call this in.

## 2018-09-06 NOTE — Telephone Encounter (Signed)
Patient advised.

## 2018-09-06 NOTE — Assessment & Plan Note (Signed)
Left cranial nerve V1 distribution. Valtrex 3 times daily, prednisone. I do not see any involvement of his left cornea, he has a bit of redness in the lateral canthus of his left eye but he is asymptomatic. We will have a low threshold for getting consultation with optometry. Anticipatory guidance given. I do not think this has anything to do with hitting his head sometime ago. Once this is all resolved I think he would benefit from Shingrix vaccination.

## 2018-09-06 NOTE — Patient Instructions (Signed)
Shingles  Shingles, which is also known as herpes zoster, is an infection that causes a painful skin rash and fluid-filled blisters. It is caused by a virus. Shingles only develops in people who:  Have had chickenpox.  Have been given a medicine to protect against chickenpox (have been vaccinated). Shingles is rare in this group. What are the causes? Shingles is caused by varicella-zoster virus (VZV). This is the same virus that causes chickenpox. After a person is exposed to VZV, the virus stays in the body in an inactive (dormant) state. Shingles develops if the virus is reactivated. This can happen many years after the first (initial) exposure to VZV. It is not known what causes this virus to be reactivated. What increases the risk? People who have had chickenpox or received the chickenpox vaccine are at risk for shingles. Shingles infection is more common in people who:  Are older than age 60.  Have a weakened disease-fighting system (immune system), such as people with: ? HIV. ? AIDS. ? Cancer.  Are taking medicines that weaken the immune system, such as transplant medicines.  Are experiencing a lot of stress. What are the signs or symptoms? Early symptoms of this condition include itching, tingling, and pain in an area on your skin. Pain may be described as burning, stabbing, or throbbing. A few days or weeks after early symptoms start, a painful red rash appears. The rash is usually on one side of the body and has a band-like or belt-like pattern. The rash eventually turns into fluid-filled blisters that break open, change into scabs, and dry up in about 2-3 weeks. At any time during the infection, you may also develop:  A fever.  Chills.  A headache.  An upset stomach. How is this diagnosed? This condition is diagnosed with a skin exam. Skin or fluid samples may be taken from the blisters before a diagnosis is made. These samples are examined under a microscope or sent to  a lab for testing. How is this treated? The rash may last for several weeks. There is not a specific cure for this condition. Your health care provider will probably prescribe medicines to help you manage pain, recover more quickly, and avoid long-term problems. Medicines may include:  Antiviral drugs.  Anti-inflammatory drugs.  Pain medicines.  Anti-itching medicines (antihistamines). If the area involved is on your face, you may be referred to a specialist, such as an eye doctor (ophthalmologist) or an ear, nose, and throat (ENT) doctor (otolaryngologist) to help you avoid eye problems, chronic pain, or disability. Follow these instructions at home: Medicines  Take over-the-counter and prescription medicines only as told by your health care provider.  Apply an anti-itch cream or numbing cream to the affected area as told by your health care provider. Relieving itching and discomfort   Apply cold, wet cloths (cold compresses) to the area of the rash or blisters as told by your health care provider.  Cool baths can be soothing. Try adding baking soda or dry oatmeal to the water to reduce itching. Do not bathe in hot water. Blister and rash care  Keep your rash covered with a loose bandage (dressing). Wear loose-fitting clothing to help ease the pain of material rubbing against the rash.  Keep your rash and blisters clean by washing the area with mild soap and cool water as told by your health care provider.  Check your rash every day for signs of infection. Check for: ? More redness, swelling, or pain. ? Fluid   or blood. ? Warmth. ? Pus or a bad smell.  Do not scratch your rash or pick at your blisters. To help avoid scratching: ? Keep your fingernails clean and cut short. ? Wear gloves or mittens while you sleep, if scratching is a problem. General instructions  Rest as told by your health care provider.  Keep all follow-up visits as told by your health care provider. This  is important.  Wash your hands often with soap and water. If soap and water are not available, use hand sanitizer. Doing this lowers your chance of getting a bacterial skin infection.  Before your blisters change into scabs, your shingles infection can cause chickenpox in people who have never had it or have never been vaccinated against it. To prevent this from happening, avoid contact with other people, especially: ? Babies. ? Pregnant women. ? Children who have eczema. ? Elderly people who have transplants. ? People who have chronic illnesses, such as cancer or AIDS. Contact a health care provider if:  Your pain is not relieved with prescribed medicines.  Your pain does not get better after the rash heals.  You have signs of infection in the rash area, such as: ? More redness, swelling, or pain around the rash. ? Fluid or blood coming from the rash. ? The rash area feeling warm to the touch. ? Pus or a bad smell coming from the rash. Get help right away if:  The rash is on your face or nose.  You have facial pain, pain around your eye area, or loss of feeling on one side of your face.  You have difficulty seeing.  You have ear pain or have ringing in your ear.  You have a loss of taste.  Your condition gets worse. Summary  Shingles, which is also known as herpes zoster, is an infection that causes a painful skin rash and fluid-filled blisters.  This condition is diagnosed with a skin exam. Skin or fluid samples may be taken from the blisters and examined before the diagnosis is made.  Keep your rash covered with a loose bandage (dressing). Wear loose-fitting clothing to help ease the pain of material rubbing against the rash.  Before your blisters change into scabs, your shingles infection can cause chickenpox in people who have never had it or have never been vaccinated against it. This information is not intended to replace advice given to you by your health care  provider. Make sure you discuss any questions you have with your health care provider. Document Released: 12/21/2004 Document Revised: 04/14/2018 Document Reviewed: 08/25/2016 Elsevier Patient Education  2020 Elsevier Inc.  

## 2018-09-12 ENCOUNTER — Other Ambulatory Visit: Payer: Self-pay

## 2018-09-12 ENCOUNTER — Ambulatory Visit (INDEPENDENT_AMBULATORY_CARE_PROVIDER_SITE_OTHER): Payer: BC Managed Care – PPO | Admitting: Family Medicine

## 2018-09-12 VITALS — BP 128/71 | HR 69 | Ht 73.0 in | Wt 152.0 lb

## 2018-09-12 DIAGNOSIS — F17200 Nicotine dependence, unspecified, uncomplicated: Secondary | ICD-10-CM | POA: Diagnosis not present

## 2018-09-12 DIAGNOSIS — R058 Other specified cough: Secondary | ICD-10-CM

## 2018-09-12 DIAGNOSIS — R05 Cough: Secondary | ICD-10-CM | POA: Diagnosis not present

## 2018-09-12 MED ORDER — ALBUTEROL SULFATE HFA 108 (90 BASE) MCG/ACT IN AERS
4.0000 | INHALATION_SPRAY | Freq: Once | RESPIRATORY_TRACT | Status: AC
  Administered 2018-09-12: 16:00:00 4 via RESPIRATORY_TRACT

## 2018-09-12 NOTE — Patient Instructions (Signed)
Thank you for coming in today. The lung function test does show changes in the lung that do not improve with albuterol and changes that do improve with albuterol.   Work on quitting smoking.   Use albuterol as needed for cough or wheeze and prior to exercise.   We can add other daily inhalers as needed.   Keep [place] updated.    Chronic Obstructive Pulmonary Disease Chronic obstructive pulmonary disease (COPD) is a long-term (chronic) lung problem. When you have COPD, it is hard for air to get in and out of your lungs. Usually the condition gets worse over time, and your lungs will never return to normal. There are things you can do to keep yourself as healthy as possible.  Your doctor may treat your condition with: ? Medicines. ? Oxygen. ? Lung surgery.  Your doctor may also recommend: ? Rehabilitation. This includes steps to make your body work better. It may involve a team of specialists. ? Quitting smoking, if you smoke. ? Exercise and changes to your diet. ? Comfort measures (palliative care). Follow these instructions at home: Medicines  Take over-the-counter and prescription medicines only as told by your doctor.  Talk to your doctor before taking any cough or allergy medicines. You may need to avoid medicines that cause your lungs to be dry. Lifestyle  If you smoke, stop. Smoking makes the problem worse. If you need help quitting, ask your doctor.  Avoid being around things that make your breathing worse. This may include smoke, chemicals, and fumes.  Stay active, but remember to rest as well.  Learn and use tips on how to relax.  Make sure you get enough sleep. Most adults need at least 7 hours of sleep every night.  Eat healthy foods. Eat smaller meals more often. Rest before meals. Controlled breathing Learn and use tips on how to control your breathing as told by your doctor. Try:  Breathing in (inhaling) through your nose for 1 second. Then, pucker your lips  and breath out (exhale) through your lips for 2 seconds.  Putting one hand on your belly (abdomen). Breathe in slowly through your nose for 1 second. Your hand on your belly should move out. Pucker your lips and breathe out slowly through your lips. Your hand on your belly should move in as you breathe out.  Controlled coughing Learn and use controlled coughing to clear mucus from your lungs. Follow these steps: 1. Lean your head a little forward. 2. Breathe in deeply. 3. Try to hold your breath for 3 seconds. 4. Keep your mouth slightly open while coughing 2 times. 5. Spit any mucus out into a tissue. 6. Rest and do the steps again 1 or 2 times as needed. General instructions  Make sure you get all the shots (vaccines) that your doctor recommends. Ask your doctor about a flu shot and a pneumonia shot.  Use oxygen therapy and pulmonary rehabilitation if told by your doctor. If you need home oxygen therapy, ask your doctor if you should buy a tool to measure your oxygen level (oximeter).  Make a COPD action plan with your doctor. This helps you to know what to do if you feel worse than usual.  Manage any other conditions you have as told by your doctor.  Avoid going outside when it is very hot, cold, or humid.  Avoid people who have a sickness you can catch (contagious).  Keep all follow-up visits as told by your doctor. This is important. Contact  a doctor if:  You cough up more mucus than usual.  There is a change in the color or thickness of the mucus.  It is harder to breathe than usual.  Your breathing is faster than usual.  You have trouble sleeping.  You need to use your medicines more often than usual.  You have trouble doing your normal activities such as getting dressed or walking around the house. Get help right away if:  You have shortness of breath while resting.  You have shortness of breath that stops you from: ? Being able to talk. ? Doing normal  activities.  Your chest hurts for longer than 5 minutes.  Your skin color is more blue than usual.  Your pulse oximeter shows that you have low oxygen for longer than 5 minutes.  You have a fever.  You feel too tired to breathe normally. Summary  Chronic obstructive pulmonary disease (COPD) is a long-term lung problem.  The way your lungs work will never return to normal. Usually the condition gets worse over time. There are things you can do to keep yourself as healthy as possible.  Take over-the-counter and prescription medicines only as told by your doctor.  If you smoke, stop. Smoking makes the problem worse. This information is not intended to replace advice given to you by your health care provider. Make sure you discuss any questions you have with your health care provider. Document Released: 06/09/2007 Document Revised: 12/03/2016 Document Reviewed: 01/26/2016 Elsevier Patient Education  2020 [employer].

## 2018-09-12 NOTE — Progress Notes (Signed)
Christopher Wells is a 53 y.o. male who presents to [facility] today for follow-up spirometry and cough.  Patient was scheduled today for spirometry and evaluation.  He notes that he has had a mild chronic cough for some time now and that he smokes about a pack of cigarettes per day.  He does note when asked some dyspnea on exertion.  He states that in the past he was able to quit smoking cold [place] and would like to do that again if possible.  His wife also wants to quit smoking at the same time.    ROS as above:  Exam:  BP 128/71   Pulse 69   Ht 6\' 1"  (1.854 m)   Wt 152 lb (68.9 kg)   SpO2 99%   BMI 20.05 kg/m  Wt Readings from Last 5 Encounters:  09/12/18 152 lb (68.9 kg)  09/06/18 151 lb (68.5 kg)  08/22/18 150 lb (68 kg)  03/31/17 148 lb (67.1 kg)  03/10/17 152 lb (68.9 kg)    Gen: Well NAD HEENT: EOMI,  MMM Lungs: Normal work of breathing. CTABL Heart: RRR no MRG Abd: NABS, Soft. Nondistended, Nontender Exts: Brisk capillary refill, warm and well perfused.   Lab and Radiology Results      Assessment and Plan: 53 y.o. male with  Cough.  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  The F EV 1/FVC percent is hovering right around 70% which is characteristic for COPD however his percent predicted of this value is 97%.  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  Clearly he has some asthma-like changes to his lungs however I am suspicious that he is in the process of being COPD. Marland Kitchen  Fortunately his symptoms are somewhat mild.  Plan to stop smoking like we discussed and use albuterol as needed.  If not sufficiently controlled but patient will likely benefit from LABA/LAMA therapy.   Will route back to PCP for further evaluation and discussion.  PDMP not reviewed this encounter. No orders of the defined types were placed in this encounter.   No orders of the defined types were placed in this encounter.    Historical information moved to improve visibility of documentation.  Past Medical History:  Diagnosis Date  . Arthritis    DDD- lumbar  . Heavy tobacco smoker   . Lumbar degenerative disc disease   . Recurrent upper respiratory infection (URI)    took z-pak, for URI- started 02/03/2011   Past Surgical History:  Procedure Laterality Date  . ANTERIOR LUMBAR FUSION  02/11/2011   Procedure: ANTERIOR LUMBAR FUSION 1 LEVEL;  Surgeon: Johnn Hai, MD;  Location: [facility];  Service: Orthopedics;  Laterality: N/A;  ALIVL5-S1  . BACK SURGERY     lumbar surgery- x3 prev. (first 2 in [place])   . LUMBAR DISC SURGERY     x 3   Social History   Tobacco Use  . Smoking status: Current Every Day Smoker    Packs/day: 1.00    Years: 35.00    Pack years: 35.00    Types: Cigarettes  . Smokeless tobacco: Former Network engineer Use Topics  . Alcohol use: Not Currently    Comment: socially   family history includes Cancer in his brother; Dementia in his mother; Diabetes in his brother, brother, and mother; Heart disease in his father; Hyperlipidemia in his father; Hypertension in his father; Prostate cancer in his  brother.  Medications: Current Outpatient Medications  Medication Sig Dispense Refill  . albuterol (VENTOLIN HFA) 108 (90 Base) MCG/ACT inhaler Inhale 1-2 puffs into the lungs every 4 (four) hours as needed for shortness of breath (bronchospasm). 6.7 g 0  . cetirizine (ZYRTEC) 10 MG tablet Take 10 mg by mouth daily.    . IBUPROFEN PO Take by mouth.    . sildenafil (REVATIO) 20 MG tablet Take 1-5 tablets (20-100 mg total) by mouth as needed (sexual activity). Max 100 mg/day 50 tablet 3   No current facility-administered medications for this visit.    No Known Allergies   Discussed warning signs or symptoms. Please see discharge instructions. Patient expresses understanding.

## 2018-10-09 NOTE — Addendum Note (Signed)
Addended by: Alena Bills R on: 10/09/2018 02:48 PM   Modules accepted: Orders

## 2019-02-27 DIAGNOSIS — S161XXA Strain of muscle, fascia and tendon at neck level, initial encounter: Secondary | ICD-10-CM | POA: Diagnosis not present

## 2019-02-27 DIAGNOSIS — S0003XA Contusion of scalp, initial encounter: Secondary | ICD-10-CM | POA: Diagnosis not present

## 2019-02-27 DIAGNOSIS — M546 Pain in thoracic spine: Secondary | ICD-10-CM | POA: Diagnosis not present

## 2019-02-27 DIAGNOSIS — M542 Cervicalgia: Secondary | ICD-10-CM | POA: Diagnosis not present

## 2019-02-27 DIAGNOSIS — G8911 Acute pain due to trauma: Secondary | ICD-10-CM | POA: Diagnosis not present

## 2019-02-27 DIAGNOSIS — Z79899 Other long term (current) drug therapy: Secondary | ICD-10-CM | POA: Diagnosis not present

## 2019-02-27 DIAGNOSIS — F17210 Nicotine dependence, cigarettes, uncomplicated: Secondary | ICD-10-CM | POA: Diagnosis not present

## 2019-02-27 DIAGNOSIS — S0990XA Unspecified injury of head, initial encounter: Secondary | ICD-10-CM | POA: Diagnosis not present

## 2019-02-27 DIAGNOSIS — Z041 Encounter for examination and observation following transport accident: Secondary | ICD-10-CM | POA: Diagnosis not present

## 2019-02-27 DIAGNOSIS — R519 Headache, unspecified: Secondary | ICD-10-CM | POA: Diagnosis not present

## 2019-06-19 DIAGNOSIS — R590 Localized enlarged lymph nodes: Secondary | ICD-10-CM | POA: Diagnosis not present

## 2019-06-19 DIAGNOSIS — N94819 Vulvodynia, unspecified: Secondary | ICD-10-CM | POA: Diagnosis not present

## 2019-11-21 ENCOUNTER — Ambulatory Visit (INDEPENDENT_AMBULATORY_CARE_PROVIDER_SITE_OTHER): Payer: BLUE CROSS/BLUE SHIELD | Admitting: Family Medicine

## 2019-11-21 ENCOUNTER — Encounter: Payer: Self-pay | Admitting: Family Medicine

## 2019-11-21 DIAGNOSIS — L089 Local infection of the skin and subcutaneous tissue, unspecified: Secondary | ICD-10-CM | POA: Diagnosis not present

## 2019-11-21 DIAGNOSIS — L729 Follicular cyst of the skin and subcutaneous tissue, unspecified: Secondary | ICD-10-CM

## 2019-11-21 MED ORDER — DOXYCYCLINE HYCLATE 100 MG PO TABS: 100.0000 mg | ORAL_TABLET | Freq: Two times a day (BID) | ORAL | 0 refills | Status: DC

## 2019-11-21 NOTE — Progress Notes (Signed)
Christopher Wells - 54 y.o. male MRN [id number]  Date of birth: [birth year]  Subjective Chief Complaint  Patient presents with   Mass    HPI Christopher Wells is a 54 y.o. male here today with complaint of swelling in the groin area.  He states that he noticed a small bump in his R groin/perineal area about 5-6 months ago.  It really didn't bother him until recently when he noticed it was swelling and tender.  It continued to get bigger over the past couple of days.  It has started draining some fluid today and feels a little less swollen. He has not had any fever, chills or testicular pain.    ROS:  A comprehensive ROS was completed and negative except as noted per HPI  No Known Allergies  Past Medical History:  Diagnosis Date   Arthritis    DDD- lumbar   Heavy tobacco smoker    Lumbar degenerative disc disease    Recurrent upper respiratory infection (URI)    took z-pak, for URI- started 02/03/2011    Past Surgical History:  Procedure Laterality Date   ANTERIOR LUMBAR FUSION  02/11/2011   Procedure: ANTERIOR LUMBAR FUSION 1 LEVEL;  Surgeon: Johnn Hai, MD;  Location: [facility];  Service: Orthopedics;  Laterality: N/A;  ALIVL5-S1   BACK SURGERY     lumbar surgery- x3 prev. (first 2 in [place])    LUMBAR [place]     x 3    Social History   Socioeconomic History   Marital status: Married    Spouse name: Not on file   Number of children: Not on file   Years of education: Not on file   Highest education level: Not on file  Occupational History   Not on file  Tobacco Use   Smoking status: Current Every Day Smoker    Packs/day: 1.00    Years: 35.00    Pack years: 35.00    Types: Cigarettes   Smokeless tobacco: Former Counsellor Use: Never used  Substance and Sexual Activity   Alcohol use: Not Currently    Comment: socially   Drug use: Never   Sexual activity: Yes    Birth control/protection: None  Other Topics Concern   Not on  file  Social History Narrative   Not on file   Social Determinants of Health   Financial Resource Strain:    Difficulty of Paying Living Expenses: Not on file  Food Insecurity:    Worried About Charity fundraiser in the Last Year: Not on file   [place] in the Last Year: Not on file  Transportation Needs:    Lack of Transportation (Medical): Not on file   Lack of Transportation (Non-Medical): Not on file  Physical Activity:    Days of Exercise per Week: Not on file   Minutes of Exercise per Session: Not on file  Stress:    Feeling of Stress : Not on file  Social Connections:    Frequency of Communication with Friends and Family: Not on file   Frequency of Social Gatherings with Friends and Family: Not on file   Attends Religious Services: Not on file   Active Member of Clubs or Organizations: Not on file   Attends Archivist Meetings: Not on file   Marital Status: Not on file    Family History  Problem Relation Age of Onset   Diabetes Mother  Dementia Mother    Hyperlipidemia Father    Hypertension Father    Heart disease Father    Diabetes Brother    Diabetes Brother    Prostate cancer Brother    Cancer Brother    Anesthesia problems Neg Hx    Hypotension Neg Hx    Malignant hyperthermia Neg Hx    Pseudochol deficiency Neg Hx     Health Maintenance  Topic Date Due   COVID-19 Vaccine (1) Never done   COLONOSCOPY  Never done   INFLUENZA VACCINE  Never done   TETANUS/TDAP  08/21/2028   Hepatitis C Screening  Completed   HIV Screening  Completed     ----------------------------------------------------------------------------------------------------------------------------------------------------------------------------------------------------------------- Physical Exam BP 127/73 (BP Location: Left Arm, Patient Position: Sitting, Cuff Size: Normal)    Pulse 75    Temp 98.7 F (37.1 C)    Wt 152 lb 11.2 oz  (69.3 kg)    SpO2 100%    BMI 20.15 kg/m   Physical Exam Constitutional:      Appearance: Normal appearance.  HENT:     Head: Normocephalic and atraumatic.  Cardiovascular:     Rate and Rhythm: Normal rate and regular rhythm.  Musculoskeletal:     Cervical back: Neck supple.  Skin:    Comments: Indurated area in R groin with purulent drainage.  Surround erythema.   Neurological:     Mental Status: He is alert.     ------------------------------------------------------------------------------------------------------------------------------------------------------------------------------------------------------------------- Assessment and Plan  Infected cyst of skin Area is draining spontaneously Recommend warm compresses/soaks a few times per day to encourage continued drainage.  Start doxycycline 100mg  bid x 10 days.  Instructed to follow up if not resolving or if having worsening symptoms.    Meds ordered this encounter  Medications   doxycycline (VIBRA-TABS) 100 MG tablet    Sig: Take 1 tablet (100 mg total) by mouth 2 (two) times daily.    Dispense:  20 tablet    Refill:  0    No follow-ups on file.    This visit occurred during the SARS-CoV-2 public health emergency.  Safety protocols were in place, including screening questions prior to the visit, additional usage of staff PPE, and extensive cleaning of exam room while observing appropriate contact time as indicated for disinfecting solutions.

## 2019-11-21 NOTE — Patient Instructions (Signed)
Use warm compresses/soaks several times per day to encourage continued drainage.  Start doxycycline twice daily for 10 days.  Let me know if not resolving or if symptoms worsen.

## 2019-11-21 NOTE — Assessment & Plan Note (Signed)
Area is draining spontaneously Recommend warm compresses/soaks a few times per day to encourage continued drainage.  Start doxycycline 100mg  bid x 10 days.  Instructed to follow up if not resolving or if having worsening symptoms.

## 2020-02-04 DIAGNOSIS — R42 Dizziness and giddiness: Secondary | ICD-10-CM | POA: Diagnosis not present

## 2020-02-04 DIAGNOSIS — U071 COVID-19: Secondary | ICD-10-CM | POA: Diagnosis not present

## 2020-05-06 ENCOUNTER — Encounter: Payer: Self-pay | Admitting: Family Medicine

## 2020-05-06 ENCOUNTER — Other Ambulatory Visit: Payer: Self-pay

## 2020-05-06 ENCOUNTER — Ambulatory Visit (INDEPENDENT_AMBULATORY_CARE_PROVIDER_SITE_OTHER): Payer: BC Managed Care – PPO | Admitting: Family Medicine

## 2020-05-06 VITALS — BP 122/69 | HR 61 | Temp 98.7°F | Resp 20 | Ht 73.0 in | Wt 148.0 lb

## 2020-05-06 DIAGNOSIS — D485 Neoplasm of uncertain behavior of skin: Secondary | ICD-10-CM | POA: Diagnosis not present

## 2020-05-06 DIAGNOSIS — H6123 Impacted cerumen, bilateral: Secondary | ICD-10-CM

## 2020-05-06 DIAGNOSIS — H6120 Impacted cerumen, unspecified ear: Secondary | ICD-10-CM | POA: Insufficient documentation

## 2020-05-06 NOTE — Patient Instructions (Signed)
Try using debrox drops to soften wax.  Let me know if not improving.

## 2020-05-06 NOTE — Assessment & Plan Note (Signed)
We were able to remove some wax today but recommend using debrox drops for a few days.  If not improving we discussed ENT referral.

## 2020-05-06 NOTE — Progress Notes (Signed)
Christopher Wells - 55 y.o. male MRN [id number]  Date of birth: 1965/03/28  Subjective Chief Complaint  Patient presents with  . Ear Fullness    HPI Christopher Wells is a 55 y.o. male here today with complaint of "clogged ears" and skin concern   Has had issues with cerumen impaction in the past and has had ears flushed with relief of symptoms.  HE denies pain or irritation.  No drainage from the ears.   He has a couple of nodules on his scalp that he would like checked out.  Some irritation associated with this.  Denies bleeding.   ROS:  A comprehensive ROS was completed and negative except as noted per HPI  No Known Allergies  Past Medical History:  Diagnosis Date  . Arthritis    DDD- lumbar  . Heavy tobacco smoker   . Lumbar degenerative disc disease   . Recurrent upper respiratory infection (URI)    took z-pak, for URI- started 02/03/2011    Past Surgical History:  Procedure Laterality Date  . ANTERIOR LUMBAR FUSION  02/11/2011   Procedure: ANTERIOR LUMBAR FUSION 1 LEVEL;  Surgeon: Johnn Hai, MD;  Location: [facility];  Service: Orthopedics;  Laterality: N/A;  ALIVL5-S1  . BACK SURGERY     lumbar surgery- x3 prev. (first 2 in [place])   . LUMBAR DISC SURGERY     x 3    Social History   Socioeconomic History  . Marital status: Married    Spouse name: Not on file  . Number of children: Not on file  . Years of education: Not on file  . Highest education level: Not on file  Occupational History  . Not on file  Tobacco Use  . Smoking status: Former Smoker    Packs/day: 1.00    Years: 35.00    Pack years: 35.00    Types: Cigarettes    Quit date: 10/05/2019    Years since quitting: 0.5  . Smokeless tobacco: Former Network engineer  . Vaping Use: Never used  Substance and Sexual Activity  . Alcohol use: Not Currently    Comment: socially  . Drug use: Never  . Sexual activity: Yes    Birth control/protection: None  Other Topics Concern  . Not on file  Social  History Narrative  . Not on file   Social Determinants of Health   Financial Resource Strain: Not on file  Food Insecurity: Not on file  Transportation Needs: Not on file  Physical Activity: Not on file  Stress: Not on file  Social Connections: Not on file    Family History  Problem Relation Age of Onset  . Diabetes Mother   . Dementia Mother   . Hyperlipidemia Father   . Hypertension Father   . Heart disease Father   . Diabetes Brother   . Diabetes Brother   . Prostate cancer Brother   . Cancer Brother   . Anesthesia problems Neg Hx   . Hypotension Neg Hx   . Malignant hyperthermia Neg Hx   . Pseudochol deficiency Neg Hx     Health Maintenance  Topic Date Due  . COVID-19 Vaccine (1) 05/22/2020 (Originally 09/24/1975)  . COLONOSCOPY (Pts 45-105yrs Insurance coverage will need to be confirmed)  05/06/2021 (Originally 09/23/2008)  . INFLUENZA VACCINE  08/04/2020  . TETANUS/TDAP  08/21/2028  . Hepatitis C Screening  Completed  . HIV Screening  Completed  . HPV VACCINES  Aged Out     -----------------------------------------------------------------------------------------------------------------------------------------------------------------------------------------------------------------  Physical Exam BP 122/69 (BP Location: Left Arm, Patient Position: Sitting, Cuff Size: Large)   Pulse 61   Temp 98.7 F (37.1 C) (Oral)   Resp 20   Ht 6\' 1"  (1.854 m)   Wt 148 lb (67.1 kg)   SpO2 99%   BMI 19.53 kg/m   Physical Exam Constitutional:      Appearance: Normal appearance.  HENT:     Ears:     Comments: Thick, dry wax in bilateral ear canals.  Lavage attempted with some improvement.    Skin:    General: Skin is warm and dry.     Comments: Pigmented, scaly lesion to upper right scalp.  Separate raised, flesh colored, scaly lesion on R scalp.       ------------------------------------------------------------------------------------------------------------------------------------------------------------------------------------------------------------------- Assessment and Plan  Cerumen impaction We were able to remove some wax today but recommend using debrox drops for a few days.  If not improving we discussed ENT referral.   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  Adjacent area with possible BCC.  Referral to dermatology made.     No orders of the defined types were placed in this encounter.   No follow-ups on file.    This visit occurred during the SARS-CoV-2 public health emergency.  Safety protocols were in place, including screening questions prior to the visit, additional usage of staff PPE, and extensive cleaning of exam room while observing appropriate contact time as indicated for disinfecting solutions.

## 2020-05-06 NOTE — Assessment & Plan Note (Signed)
He has one area that is consistent with SK.  Adjacent area with possible BCC.  Referral to dermatology made.

## 2020-05-07 IMAGING — DX CHEST - 2 VIEW
2 series · 2 of 2 positions shown · non-contrast
Comparison: Chest radiograph dated 02/08/2011

CLINICAL DATA: Nonproductive cough for 2-3 months, smoker.

EXAM:
CHEST - 2 VIEW

[chest pa]
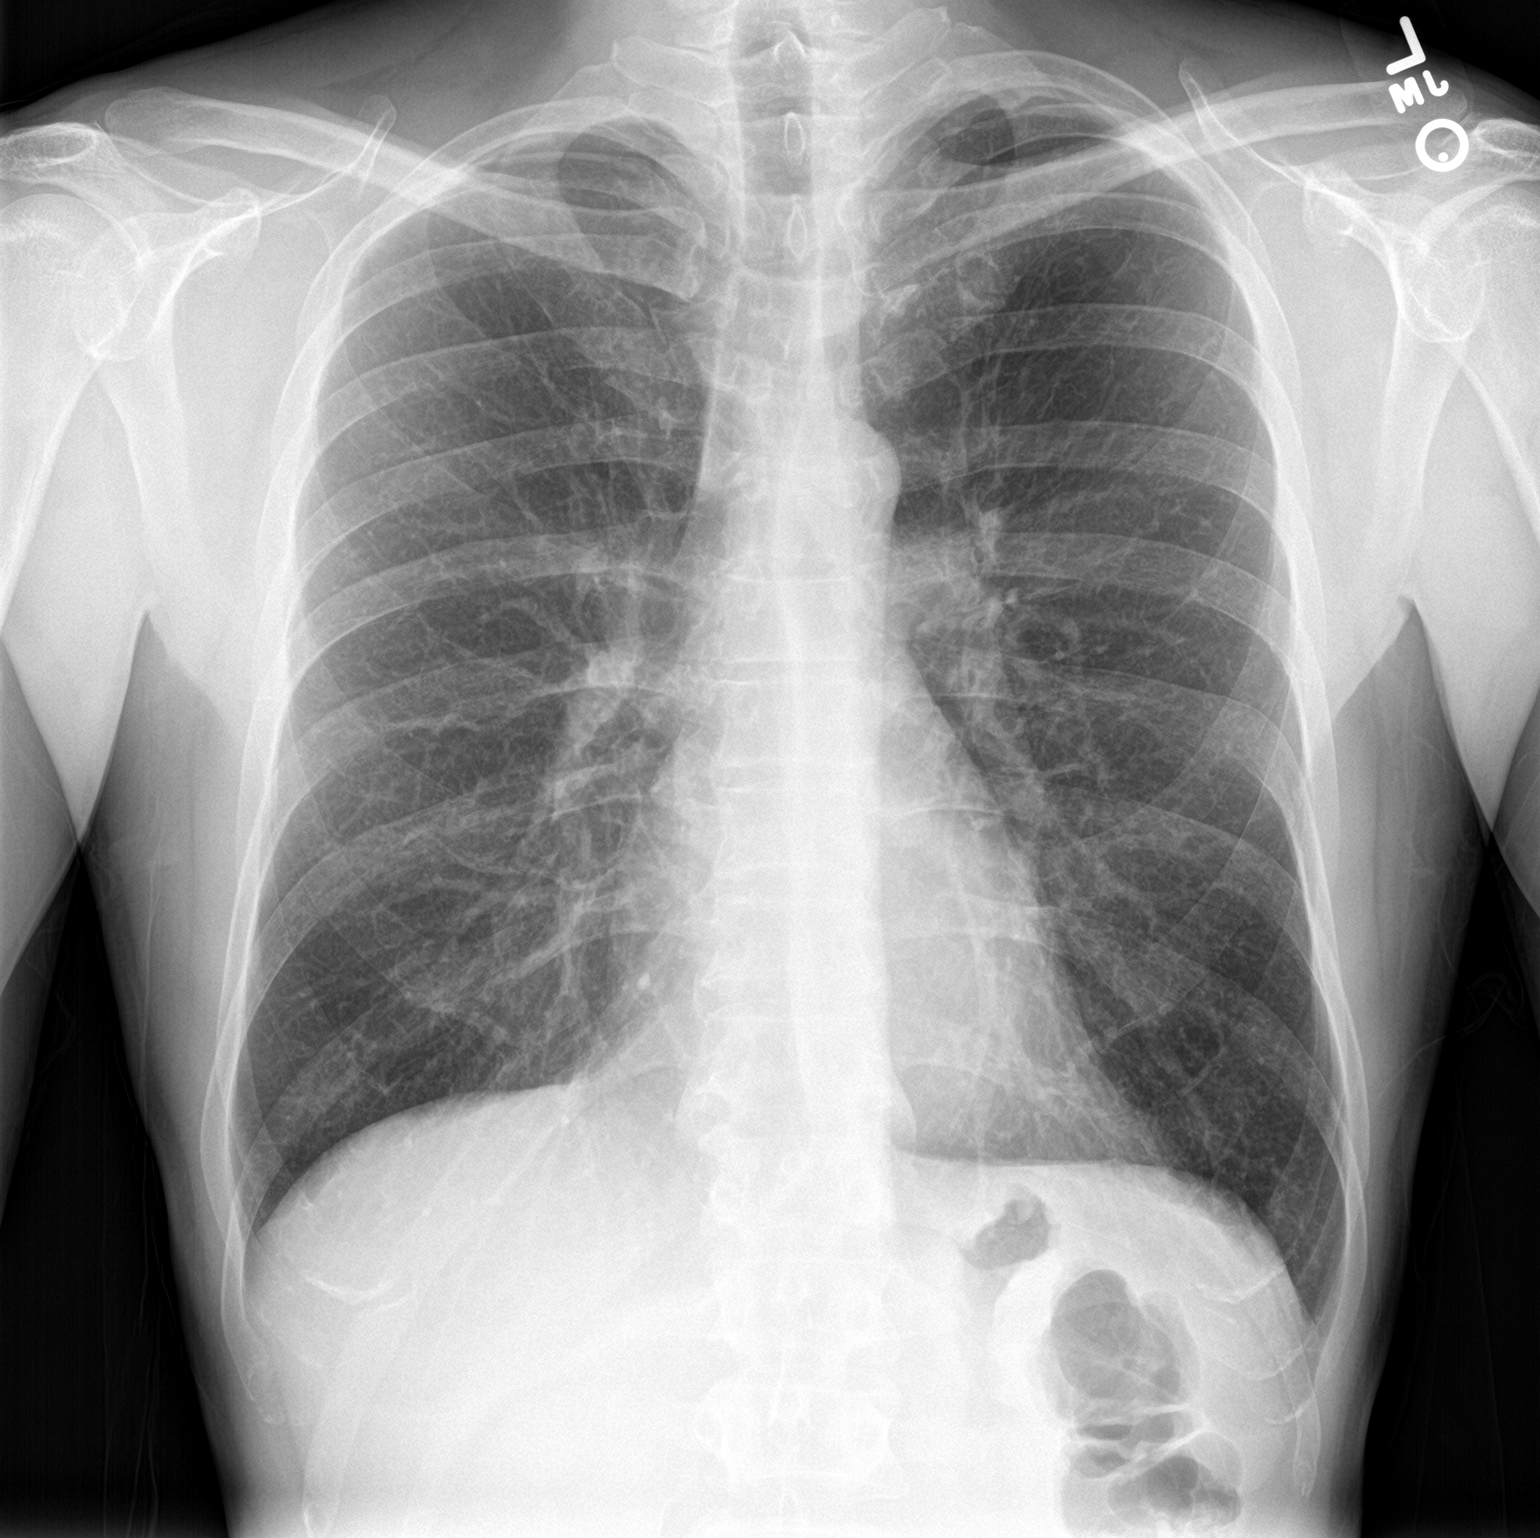

[chest lat]
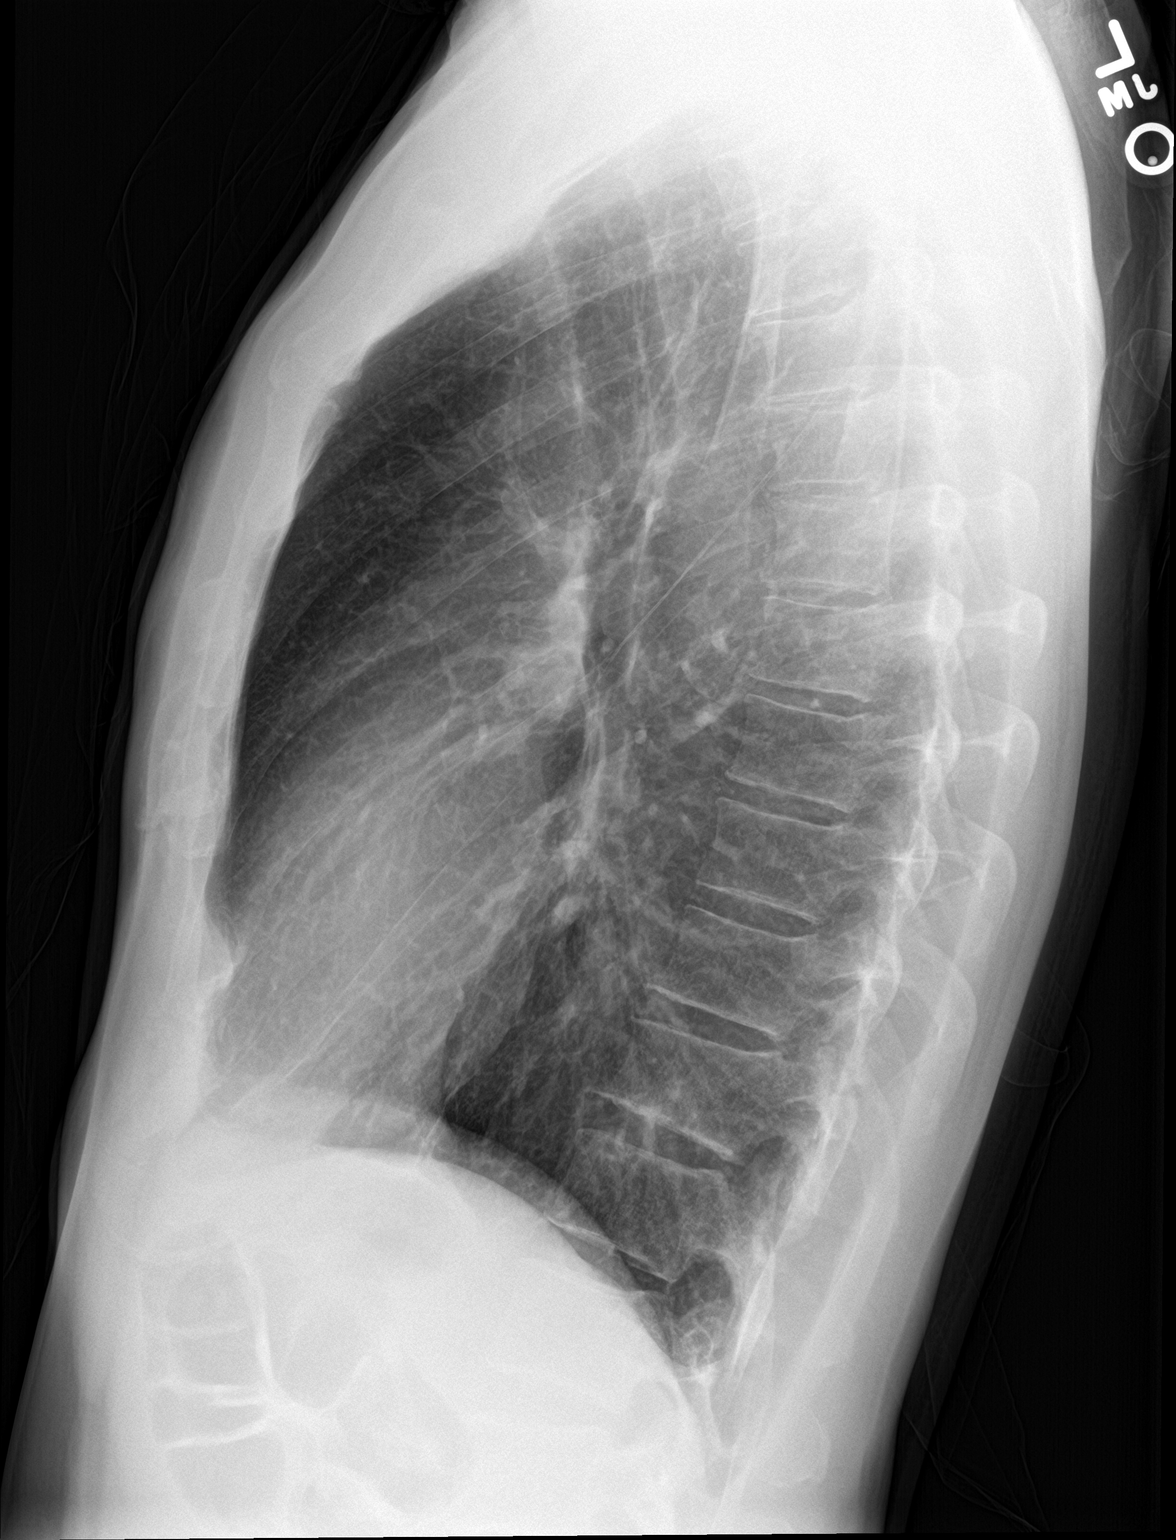

[2 of 2 positions shown; findings below may reference images not displayed]

FINDINGS: The heart size and mediastinal contours are within normal limits.
Both lungs are clear. The visualized skeletal structures are
unremarkable.
IMPRESSION: No active cardiopulmonary disease.

## 2020-05-08 DIAGNOSIS — L820 Inflamed seborrheic keratosis: Secondary | ICD-10-CM | POA: Diagnosis not present

## 2021-05-05 ENCOUNTER — Telehealth: Payer: Self-pay

## 2021-05-05 DIAGNOSIS — H6123 Impacted cerumen, bilateral: Secondary | ICD-10-CM

## 2021-05-05 NOTE — Telephone Encounter (Signed)
Pt lvm requesting referral to ENT.  ? ?Returned the call. Pt states bilateral cerumen impaction. Referral pended for Dr. Zigmund Daniel' approval.  ?

## 2021-05-06 NOTE — Telephone Encounter (Signed)
Referral signed.

## 2021-05-07 NOTE — Telephone Encounter (Signed)
Patient made aware referral entered. Patient prefers [facility] location. FYI.  ?

## 2022-03-15 ENCOUNTER — Telehealth: Payer: Self-pay | Admitting: Sports Medicine

## 2022-03-15 NOTE — Telephone Encounter (Signed)
Unfortunately I have stopped adding new primary care as it started to get difficult for my existing primary care patients to get into see me.

## 2022-03-15 NOTE — Telephone Encounter (Signed)
Patient's wife called she is requesting that her husband see you as his primary I let her know Dr. Zigmund Daniel is his pcp he doesn't want to see Dr. Zigmund Daniel please advise

## 2022-03-30 ENCOUNTER — Encounter: Payer: BC Managed Care – PPO | Admitting: Family Medicine

## 2022-04-20 ENCOUNTER — Ambulatory Visit (INDEPENDENT_AMBULATORY_CARE_PROVIDER_SITE_OTHER): Payer: 59 | Admitting: Family Medicine

## 2022-04-20 ENCOUNTER — Encounter: Payer: Self-pay | Admitting: Family Medicine

## 2022-04-20 VITALS — BP 107/64 | HR 84 | Ht 73.0 in | Wt 150.0 lb

## 2022-04-20 DIAGNOSIS — H6123 Impacted cerumen, bilateral: Secondary | ICD-10-CM

## 2022-04-20 DIAGNOSIS — Z1211 Encounter for screening for malignant neoplasm of colon: Secondary | ICD-10-CM

## 2022-04-20 DIAGNOSIS — Z0000 Encounter for general adult medical examination without abnormal findings: Secondary | ICD-10-CM | POA: Insufficient documentation

## 2022-04-20 DIAGNOSIS — N529 Male erectile dysfunction, unspecified: Secondary | ICD-10-CM | POA: Diagnosis not present

## 2022-04-20 DIAGNOSIS — Z87891 Personal history of nicotine dependence: Secondary | ICD-10-CM | POA: Diagnosis not present

## 2022-04-20 DIAGNOSIS — J449 Chronic obstructive pulmonary disease, unspecified: Secondary | ICD-10-CM

## 2022-04-20 DIAGNOSIS — Z125 Encounter for screening for malignant neoplasm of prostate: Secondary | ICD-10-CM

## 2022-04-20 MED ORDER — SILDENAFIL CITRATE 20 MG PO TABS: 20.0000 mg | ORAL_TABLET | ORAL | 3 refills | Status: DC | PRN

## 2022-04-20 MED ORDER — ALBUTEROL SULFATE HFA 108 (90 BASE) MCG/ACT IN AERS: 1.0000 | INHALATION_SPRAY | RESPIRATORY_TRACT | 0 refills | Status: AC | PRN

## 2022-04-20 NOTE — Assessment & Plan Note (Signed)
Well adult Orders Placed This Encounter  Procedures   CT CHEST LUNG CA SCREEN LOW DOSE W/O CM    Standing Status:   Future    Standing Expiration Date:   04/20/2023    Order Specific Question:   Preferred Imaging Location?    Answer:   [facility]   COMPLETE METABOLIC PANEL WITH GFR   CBC with Differential   Lipid Panel w/reflex Direct LDL   PSA   Ambulatory referral to Gastroenterology    Referral Priority:   Routine    Referral Type:   Consultation    Referral Reason:   Specialty Services Required    Number of Visits Requested:   1  Screenings: per lab orders.  Referral placed for colon cancer screening.  Lung cancer screening discussed.  He would like to proceed with this.  Immunizations:  declines.  Anticipatory guidance/Risk factor reduction:  recommendations per AVS.

## 2022-04-20 NOTE — Progress Notes (Signed)
Christopher Wells - 57 y.o. male MRN [id number]  Date of birth: [birth year]  Subjective Chief Complaint  Patient presents with   Annual Exam    HPI Christopher Wells is a 57 y.o. male here today for annual exam.  He reports that he is feeling well today.    His activity level is pretty good.  He feels that his diet is pretty good.   He has never had colon cancer screening.  He does have a 35 pack year history of smoking and quit in 2021.  He is interested in lung cancer screening.    Review of Systems  Constitutional:  Negative for chills, fever, malaise/fatigue and weight loss.  HENT:  Negative for congestion, ear pain and sore throat.   Eyes:  Negative for blurred vision, double vision and pain.  Respiratory:  Negative for cough and shortness of breath.   Cardiovascular:  Negative for chest pain and palpitations.  Gastrointestinal:  Negative for abdominal pain, blood in stool, constipation, heartburn and nausea.  Genitourinary:  Negative for dysuria and urgency.  Musculoskeletal:  Negative for joint pain and myalgias.  Neurological:  Negative for dizziness and headaches.  Endo/Heme/Allergies:  Does not bruise/bleed easily.  Psychiatric/Behavioral:  Negative for depression. The patient is not nervous/anxious and does not have insomnia.     No Known Allergies  Past Medical History:  Diagnosis Date   Arthritis    DDD- lumbar   Heavy tobacco smoker    Lumbar degenerative disc disease    Recurrent upper respiratory infection (URI)    took z-pak, for URI- started 02/03/2011    Past Surgical History:  Procedure Laterality Date   ANTERIOR LUMBAR FUSION  02/11/2011   Procedure: ANTERIOR LUMBAR FUSION 1 LEVEL;  Surgeon: Javier Docker, MD;  Location[facility];  Service: Orthopedics;  Laterality: N/A;  ALIVL5-S1   BACK SURGERY     lumbar surgery- x3 prev. (first 2 in [place])    LUMBAR DISC SURGERY     x 3    Social History   Socioeconomic History   Marital status: Married     Spouse name: Not on file   Number of children: Not on file   Years of education: Not on file   Highest education level: Not on file  Occupational History   Not on file  Tobacco Use   Smoking status: Former    Packs/day: 1.00    Years: 35.00    Additional pack years: 0.00    Total pack years: 35.00    Types: Cigarettes    Quit date: 10/05/2019    Years since quitting: 2.5   Smokeless tobacco: Former  Building services engineer Use: Never used  Substance and Sexual Activity   Alcohol use: Not Currently    Comment: socially   Drug use: Never   Sexual activity: Yes    Birth control/protection: None  Other Topics Concern   Not on file  Social History Narrative   Not on file   Social Determinants of Health   Financial Resource Strain: Not on file  Food Insecurity: Not on file  Transportation Needs: Not on file  Physical Activity: Not on file  Stress: Not on file  Social Connections: Not on file    Family History  Problem Relation Age of Onset   Diabetes Mother    Dementia Mother    Hyperlipidemia Father    Hypertension Father    Heart disease Father    Diabetes Brother  Diabetes Brother    Prostate cancer Brother    Cancer Brother    Anesthesia problems Neg Hx    Hypotension Neg Hx    Malignant hyperthermia Neg Hx    Pseudochol deficiency Neg Hx     Health Maintenance  Topic Date Due   COVID-19 Vaccine (1) 10/06/2022 (Originally 09/23/1968)   Zoster Vaccines- Shingrix (1 of 2) 10/20/2022 (Originally 09/24/1982)   Lung Cancer Screening  04/20/2023 (Originally 09/23/2013)   HIV Screening  04/20/2023 (Originally 09/24/1978)   COLONOSCOPY (Pts 45-47yrs Insurance coverage will need to be confirmed)  10/20/2023 (Originally 09/23/2008)   INFLUENZA VACCINE  08/05/2022   DTaP/Tdap/Td (2 - Td or Tdap) 08/21/2028   Hepatitis C Screening  Completed   HPV VACCINES  Aged Out      ----------------------------------------------------------------------------------------------------------------------------------------------------------------------------------------------------------------- Physical Exam BP 107/64 (BP Location: Left Arm, Patient Position: Sitting, Cuff Size: Small)   Pulse 84   Ht  (1.854 m)   Wt 150 lb (68 kg)   SpO2 98%   BMI 19.79 kg/m   Physical Exam Constitutional:      General: He is not in acute distress. HENT:     Head: Normocephalic and atraumatic.     Right Ear: Tympanic membrane and external ear normal.     Left Ear: Tympanic membrane and external ear normal.     Mouth/Throat:     Comments: B/L cerumen impaction.  Eyes:     General: No scleral icterus. Neck:     Thyroid: No thyromegaly.  Cardiovascular:     Rate and Rhythm: Normal rate and regular rhythm.     Heart sounds: Normal heart sounds.  Pulmonary:     Effort: Pulmonary effort is normal.     Breath sounds: Normal breath sounds.  Abdominal:     General: Bowel sounds are normal. There is no distension.     Palpations: Abdomen is soft.     Tenderness: There is no abdominal tenderness. There is no guarding.  Musculoskeletal:     Cervical back: Normal range of motion.  Lymphadenopathy:     Cervical: No cervical adenopathy.  Skin:    General: Skin is warm and dry.     Findings: No rash.  Neurological:     Mental Status: He is alert and oriented to person, place, and time.     Cranial Nerves: No cranial nerve deficit.     Motor: No abnormal muscle tone.  Psychiatric:        Mood and Affect: Mood normal.        Behavior: Behavior normal.     ------------------------------------------------------------------------------------------------------------------------------------------------------------------------------------------------------------------- Assessment and Plan  Well adult exam Well adult Orders Placed This Encounter  Procedures   CT CHEST  LUNG CA SCREEN LOW DOSE W/O CM    Standing Status:   Future    Standing Expiration Date:   04/20/2023    Order Specific Question:   Preferred Imaging Location?    Answer:   [facility]   COMPLETE METABOLIC PANEL WITH GFR   CBC with Differential   Lipid Panel w/reflex Direct LDL   PSA   Ambulatory referral to Gastroenterology    Referral Priority:   Routine    Referral Type:   Consultation    Referral Reason:   Specialty Services Required    Number of Visits Requested:   1  Screenings: per lab orders.  Referral placed for colon cancer screening.  Lung cancer screening discussed.  He would like to proceed with this.  Immunizations:  declines.  Anticipatory guidance/Risk factor reduction:  recommendations per AVS.   Cerumen impaction Tolerated lavage of the left ear with good results.  He will return to have right ear completed later.   Meds ordered this encounter  Medications   albuterol (VENTOLIN HFA) 108 (90 Base) MCG/ACT inhaler    Sig: Inhale 1-2 puffs into the lungs every 4 (four) hours as needed for shortness of breath (bronchospasm).    Dispense:  6.7 g    Refill:  0   sildenafil (REVATIO) 20 MG tablet    Sig: Take 1-5 tablets (20-100 mg total) by mouth as needed (sexual activity). Max 100 mg/day    Dispense:  50 tablet    Refill:  3    No follow-ups on file.    This visit occurred during the SARS-CoV-2 public health emergency.  Safety protocols were in place, including screening questions prior to the visit, additional usage of staff PPE, and extensive cleaning of exam room while observing appropriate contact time as indicated for disinfecting solutions.

## 2022-04-20 NOTE — Patient Instructions (Signed)

## 2022-04-20 NOTE — Assessment & Plan Note (Signed)
Tolerated lavage of the left ear with good results.  He will return to have right ear completed later.

## 2022-05-06 ENCOUNTER — Encounter: Payer: Self-pay | Admitting: Gastroenterology

## 2022-05-14 ENCOUNTER — Ambulatory Visit: Payer: 59

## 2022-05-18 LAB — CBC WITH DIFFERENTIAL/PLATELET
Basophils Relative: 0.6 %
Eosinophils Absolute: 231 cells/uL (ref 15–500)
Eosinophils Relative: 2.2 %
HCT: 44.5 % (ref 38.5–50.0)
MCHC: 33.9 g/dL (ref 32.0–36.0)
MPV: 10.5 fL (ref 7.5–12.5)
Monocytes Relative: 5.9 %
Neutrophils Relative %: 71.7 %
Platelets: 314 10*3/uL (ref 140–400)
RBC: 5.21 10*6/uL (ref 4.20–5.80)
RDW: 12.2 % (ref 11.0–15.0)
WBC: 10.5 10*3/uL (ref 3.8–10.8)

## 2022-05-19 LAB — COMPLETE METABOLIC PANEL WITH GFR
AG Ratio: 1.9 (calc) (ref 1.0–2.5)
ALT: 13 U/L (ref 9–46)
AST: 14 U/L (ref 10–35)
Albumin: 4.4 g/dL (ref 3.6–5.1)
Alkaline phosphatase (APISO): 64 U/L (ref 35–144)
BUN: 17 mg/dL (ref 7–25)
CO2: 29 mmol/L (ref 20–32)
Calcium: 9.7 mg/dL (ref 8.6–10.3)
Chloride: 100 mmol/L (ref 98–110)
Creat: 1.28 mg/dL (ref 0.70–1.30)
Globulin: 2.3 g/dL (calc) (ref 1.9–3.7)
Glucose, Bld: 103 mg/dL — ABNORMAL HIGH (ref 65–99)
Potassium: 4.3 mmol/L (ref 3.5–5.3)
Sodium: 137 mmol/L (ref 135–146)
Total Bilirubin: 0.5 mg/dL (ref 0.2–1.2)
Total Protein: 6.7 g/dL (ref 6.1–8.1)
eGFR: 65 mL/min/{1.73_m2} (ref >=60)

## 2022-05-19 LAB — CBC WITH DIFFERENTIAL/PLATELET
Absolute Monocytes: 620 cells/uL (ref 200–950)
Basophils Absolute: 63 cells/uL (ref 0–200)
Hemoglobin: 15.1 g/dL (ref 13.2–17.1)
Lymphs Abs: 2058 cells/uL (ref 850–3900)
MCH: 29 pg (ref 27.0–33.0)
MCV: 85.4 fL (ref 80.0–100.0)
Neutro Abs: 7529 cells/uL (ref 1500–7800)
Total Lymphocyte: 19.6 %

## 2022-05-19 LAB — LIPID PANEL W/REFLEX DIRECT LDL
Cholesterol: 163 mg/dL (ref <200)
HDL: 58 mg/dL (ref >=40)
LDL Cholesterol (Calc): 74 mg/dL (calc)
Non-HDL Cholesterol (Calc): 105 mg/dL (calc) (ref <130)
Total CHOL/HDL Ratio: 2.8 (calc) (ref <5.0)
Triglycerides: 213 mg/dL — ABNORMAL HIGH (ref <150)

## 2022-05-19 LAB — PSA: PSA: 24.31 ng/mL — ABNORMAL HIGH (ref <=4.00)

## 2022-05-25 ENCOUNTER — Ambulatory Visit (AMBULATORY_SURGERY_CENTER): Payer: 59

## 2022-05-25 ENCOUNTER — Encounter: Payer: Self-pay | Admitting: Gastroenterology

## 2022-05-25 VITALS — Ht 73.0 in | Wt 155.0 lb

## 2022-05-25 DIAGNOSIS — Z1211 Encounter for screening for malignant neoplasm of colon: Secondary | ICD-10-CM

## 2022-05-25 MED ORDER — NA SULFATE-K SULFATE-MG SULF 17.5-3.13-1.6 GM/177ML PO SOLN: 1.0000 | Freq: Once | ORAL | 0 refills | Status: AC

## 2022-05-25 NOTE — Progress Notes (Signed)

## 2022-06-02 ENCOUNTER — Ambulatory Visit: Payer: 59

## 2022-06-04 ENCOUNTER — Other Ambulatory Visit: Payer: Self-pay | Admitting: Family Medicine

## 2022-06-04 DIAGNOSIS — R972 Elevated prostate specific antigen [PSA]: Secondary | ICD-10-CM

## 2022-06-07 ENCOUNTER — Telehealth: Payer: Self-pay | Admitting: Family Medicine

## 2022-06-07 NOTE — Telephone Encounter (Signed)
Patient advised.

## 2022-06-07 NOTE — Telephone Encounter (Signed)
Patient called he would like a call back to go over his PSA results [phone]

## 2022-06-15 ENCOUNTER — Ambulatory Visit (INDEPENDENT_AMBULATORY_CARE_PROVIDER_SITE_OTHER): Payer: 59 | Admitting: Urology

## 2022-06-15 ENCOUNTER — Encounter: Payer: Self-pay | Admitting: Urology

## 2022-06-15 VITALS — BP 122/77 | HR 57 | Ht 73.0 in | Wt 150.0 lb

## 2022-06-15 DIAGNOSIS — R972 Elevated prostate specific antigen [PSA]: Secondary | ICD-10-CM

## 2022-06-15 LAB — URINALYSIS, ROUTINE W REFLEX MICROSCOPIC
Bilirubin, UA: NEGATIVE
Glucose, UA: NEGATIVE
Ketones, UA: NEGATIVE
Leukocytes,UA: NEGATIVE
Nitrite, UA: NEGATIVE
Protein,UA: NEGATIVE
RBC, UA: NEGATIVE
Specific Gravity, UA: 1.03 (ref 1.005–1.030)
Urobilinogen, Ur: 0.2 mg/dL (ref 0.2–1.0)
pH, UA: 6 (ref 5.0–7.5)

## 2022-06-15 NOTE — Progress Notes (Signed)
Assessment: 1. Elevated PSA      Plan: Today I had a long discussion with the patient and his wife regarding his markedly elevated PSA along with the issues and controversies regarding prostate cancer detection.  We discussed options for further evaluation including prostate imaging and biopsy.  Patient and wife had numerous questions today which I answered.  Following our discussion, patient elects to proceed with transrectal ultrasound and prostate biopsy. Ashby Dawes procedure was reviewed in detail today including potential adverse events and complications.  I especially emphasized the risk for infection and need for preprocedural antibiotics. Repeat PSA today Schedule biopsy next available  Chief Complaint: elevated psa  History of Present Illness:  Christopher Wells is a 57 y.o. male who is seen in consultation from Everrett Coombe, DO for evaluation of elevated PSA. Patient is accompanied by his wife today.  He is a former heavy smoker and quit in 2001.  He has not had regular routine health maintenance.  He is scheduled for his initial colonoscopy later this month.  On recent routine exam he was noted to have an elevated PSA of 24.3. No prior PSA testing Patient does have a family history of prostate cancer and an older brother who required surgical management Patient reports mild LUTS.  IPSS = 3. Patient does have ED which responds well to Viagra. Patient denies any bone pain or other constitutional complaints except for a 4 to 5 pound weight loss over the past year.   Past Medical History:  Past Medical History:  Diagnosis Date   Arthritis    DDD- lumbar   Heavy tobacco smoker    Lumbar degenerative disc disease    Recurrent upper respiratory infection (URI)    took z-pak, for URI- started 02/03/2011    Past Surgical History:  Past Surgical History:  Procedure Laterality Date   ANTERIOR LUMBAR FUSION  02/11/2011   Procedure: ANTERIOR LUMBAR FUSION 1 LEVEL;  Surgeon: Javier Docker, MD;  Location: [facility] OR;  Service: Orthopedics;  Laterality: N/A;  ALIVL5-S1   BACK SURGERY     lumbar surgery- x3 prev. (first 2 in [place])    LUMBAR DISC SURGERY     x 3    Allergies:  No Known Allergies  Family History:  Family History  Problem Relation Age of Onset   Diabetes Mother    Dementia Mother    Hyperlipidemia Father    Hypertension Father    Heart disease Father    Diabetes Brother    Diabetes Brother    Prostate cancer Brother    Cancer Brother    Anesthesia problems Neg Hx    Hypotension Neg Hx    Malignant hyperthermia Neg Hx    Pseudochol deficiency Neg Hx    Colon cancer Neg Hx    Colon polyps Neg Hx    Esophageal cancer Neg Hx    Stomach cancer Neg Hx    Rectal cancer Neg Hx     Social History:  Social History   Tobacco Use   Smoking status: Former    Packs/day: 1.00    Years: 35.00    Additional pack years: 0.00    Total pack years: 35.00    Types: Cigarettes    Quit date: 10/05/2019    Years since quitting: 2.6   Smokeless tobacco: Former  Building services engineer Use: Never used  Substance Use Topics   Alcohol use: Not Currently    Comment: socially   Drug use:  Never    Review of symptoms:  Constitutional:  Negative for unexplained weight loss, night sweats, fever, chills ENT:  Negative for nose bleeds, sinus pain, painful swallowing CV:  Negative for chest pain, shortness of breath, exercise intolerance, palpitations, loss of consciousness Resp:  Negative for cough, wheezing, shortness of breath GI:  Negative for nausea, vomiting, diarrhea, bloody stools GU:  Positives noted in HPI; otherwise negative for gross hematuria, dysuria, urinary incontinence Neuro:  Negative for seizures, poor balance, limb weakness, slurred speech Psych:  Negative for lack of energy, depression, anxiety Endocrine:  Negative for polydipsia, polyuria, symptoms of hypoglycemia (dizziness, hunger, sweating) Hematologic:  Negative for anemia, purpura,  petechia, prolonged or excessive bleeding, use of anticoagulants  Allergic:  Negative for difficulty breathing or choking as a result of exposure to anything; no shellfish allergy; no allergic response (rash/itch) to materials, foods  Physical exam: BP 122/77   Pulse (!) 57   Ht 6\' 1"  (1.854 m)   Wt 150 lb (68 kg)   BMI 19.79 kg/m  GENERAL APPEARANCE:  Well appearing, well developed, well nourished, NAD  GU:  nl ext genitalia DRE:  nst, prostate 30gm with right side induration towards base.

## 2022-06-15 NOTE — Addendum Note (Signed)
Addended by: Carolin Coy on: 06/15/2022 09:50 AM   Modules accepted: Orders

## 2022-06-15 NOTE — Patient Instructions (Signed)
Prostate Biopsy Instructions  Stop all aspirin or blood thinners (aspirin, plavix, coumadin, warfarin, motrin, ibuprofen, advil, aleve, naproxen, naprosyn) for 7 days prior to the procedure.  If you have any questions about stopping these medications, please contact your primary care physician or cardiologist.  Having a light meal prior to the procedure is recommended.  If you are diabetic or have low blood sugar please bring a small snack or glucose tablet.  A Fleets enema is needed and can be purchased over the counter at a local pharmacy. This will need to be administered 2 hours prior to your procedure. Antibiotics will be administered in the clinic at the time of the procedure unless otherwise specified.    If you have any questions or concerns, please feel free to call the office at [phone] or send a Mychart message.   Thank you,  Staff at [facility]  

## 2022-06-16 LAB — PSA: Prostate Specific Ag, Serum: 28.9 ng/mL — ABNORMAL HIGH (ref 0.0–4.0)

## 2022-06-21 ENCOUNTER — Ambulatory Visit (AMBULATORY_SURGERY_CENTER): Payer: 59 | Admitting: Gastroenterology

## 2022-06-21 ENCOUNTER — Encounter: Payer: Self-pay | Admitting: Gastroenterology

## 2022-06-21 VITALS — BP 95/57 | HR 61 | Temp 98.9°F | Resp 14

## 2022-06-21 DIAGNOSIS — D123 Benign neoplasm of transverse colon: Secondary | ICD-10-CM

## 2022-06-21 DIAGNOSIS — Z1211 Encounter for screening for malignant neoplasm of colon: Secondary | ICD-10-CM

## 2022-06-21 MED ORDER — SODIUM CHLORIDE 0.9 % IV SOLN
500.0000 mL | Freq: Once | INTRAVENOUS | Status: DC
Start: 2022-06-21 — End: 2022-06-21

## 2022-06-21 NOTE — Progress Notes (Signed)
Newly Dx. Prostate Cancer.

## 2022-06-21 NOTE — Progress Notes (Signed)
Called to room to assist during endoscopic procedure.  Patient ID and intended procedure confirmed with present staff. Received instructions for my participation in the procedure from the performing physician.  

## 2022-06-21 NOTE — Progress Notes (Signed)
[facility] History and Physical   Primary Care Physician:  Everrett Coombe, DO   Reason for Procedure:   Colon cancer screening  Plan:    Colonoscopy     HPI: Christopher Wells is a 57 y.o. male undergoing initial average risk screening colonoscopy.  He has no family history of colon cancer and no chronic GI symptoms.    Past Medical History:  Diagnosis Date   Arthritis    DDD- lumbar   Heavy tobacco smoker    Lumbar degenerative disc disease    Recurrent upper respiratory infection (URI)    took z-pak, for URI- started 02/03/2011    Past Surgical History:  Procedure Laterality Date   ANTERIOR LUMBAR FUSION  02/11/2011   Procedure: ANTERIOR LUMBAR FUSION 1 LEVEL;  Surgeon: Javier Docker, MD;  Location: [facility] OR;  Service: Orthopedics;  Laterality: N/A;  ALIVL5-S1   BACK SURGERY     lumbar surgery- x3 prev. (first 2 in [place])    LUMBAR DISC SURGERY     x 3    Prior to Admission medications   Medication Sig Start Date End Date Taking? Authorizing Provider  albuterol (VENTOLIN HFA) 108 (90 Base) MCG/ACT inhaler Inhale 1-2 puffs into the lungs every 4 (four) hours as needed for shortness of breath (bronchospasm). 04/20/22   Everrett Coombe, DO  sildenafil (REVATIO) 20 MG tablet Take 1-5 tablets (20-100 mg total) by mouth as needed (sexual activity). Max 100 mg/day 04/20/22   Everrett Coombe, DO    Current Outpatient Medications  Medication Sig Dispense Refill   albuterol (VENTOLIN HFA) 108 (90 Base) MCG/ACT inhaler Inhale 1-2 puffs into the lungs every 4 (four) hours as needed for shortness of breath (bronchospasm). 6.7 g 0   sildenafil (REVATIO) 20 MG tablet Take 1-5 tablets (20-100 mg total) by mouth as needed (sexual activity). Max 100 mg/day 50 tablet 3   Current Facility-Administered Medications  Medication Dose Route Frequency Provider Last Rate Last Admin   0.9 %  sodium chloride infusion  500 mL Intravenous Once Jenel Lucks, MD        Allergies as  of 06/21/2022   (No Known Allergies)    Family History  Problem Relation Age of Onset   Diabetes Mother    Dementia Mother    Hyperlipidemia Father    Hypertension Father    Heart disease Father    Diabetes Brother    Diabetes Brother    Prostate cancer Brother    Cancer Brother    Anesthesia problems Neg Hx    Hypotension Neg Hx    Malignant hyperthermia Neg Hx    Pseudochol deficiency Neg Hx    Colon cancer Neg Hx    Colon polyps Neg Hx    Esophageal cancer Neg Hx    Stomach cancer Neg Hx    Rectal cancer Neg Hx     Social History   Socioeconomic History   Marital status: Married    Spouse name: Not on file   Number of children: Not on file   Years of education: Not on file   Highest education level: Not on file  Occupational History   Not on file  Tobacco Use   Smoking status: Former    Packs/day: 1.00    Years: 35.00    Additional pack years: 0.00    Total pack years: 35.00    Types: Cigarettes    Quit date: 10/05/2019    Years since quitting: 2.7   Smokeless tobacco:  Former  Building services engineer Use: Never used  Substance and Sexual Activity   Alcohol use: Not Currently    Comment: socially   Drug use: Never   Sexual activity: Yes    Birth control/protection: None  Other Topics Concern   Not on file  Social History Narrative   Not on file   Social Determinants of Health   Financial Resource Strain: Not on file  Food Insecurity: Not on file  Transportation Needs: Not on file  Physical Activity: Not on file  Stress: Not on file  Social Connections: Not on file  Intimate Partner Violence: Not on file    Review of Systems:  All other review of systems negative except as mentioned in the HPI.  Physical Exam: Vital signs Temp 98.9 F (37.2 C)   General:   Alert,  Well-developed, well-nourished, pleasant and cooperative in NAD Airway:  Mallampati 3 Lungs:  Clear throughout to auscultation.   Heart:  Regular rate and rhythm; no murmurs,  clicks, rubs,  or gallops. Abdomen:  Soft, nontender and nondistended. Normal bowel sounds.   Neuro/Psych:  Normal mood and affect. A and O x 3   Ebenezer Mccaskey E. Tomasa Rand, MD [facility]

## 2022-06-21 NOTE — Op Note (Signed)
[facility] Patient Name: Christopher Wells Procedure Date: 06/21/2022 10:14 AM MRN: [id number] Endoscopist: Lorin Picket E. Tomasa Rand , MD, [id number] Age: 57 Referring MD:  Date of Birth: 10-10-1965 Gender: Male Account #: [id number] Procedure:                Colonoscopy Indications:              Screening for colorectal malignant neoplasm, This                            is the patient's first colonoscopy Medicines:                Monitored Anesthesia Care Procedure:                Pre-Anesthesia Assessment:                           - Prior to the procedure, a History and Physical                            was performed, and patient medications and                            allergies were reviewed. The patient's tolerance of                            previous anesthesia was also reviewed. The risks                            and benefits of the procedure and the sedation                            options and risks were discussed with the patient.                            All questions were answered, and informed consent                            was obtained. Prior Anticoagulants: The patient has                            taken no anticoagulant or antiplatelet agents. ASA                            Grade Assessment: II - A patient with mild systemic                            disease. After reviewing the risks and benefits,                            the patient was deemed in satisfactory condition to                            undergo the procedure.  After obtaining informed consent, the colonoscope                            was passed under direct vision. Throughout the                            procedure, the patient's blood pressure, pulse, and                            oxygen saturations were monitored continuously. The                            CF [id number] #[id number] was introduced through the anus                            and advanced to  the the cecum, identified by                            appendiceal orifice and ileocecal valve. The                            colonoscopy was somewhat difficult due to a                            tortuous colon. Successful completion of the                            procedure was aided by using manual pressure. The                            patient tolerated the procedure well. The quality                            of the bowel preparation was good. The ileocecal                            valve, appendiceal orifice, and rectum were                            photographed. The bowel preparation used was SUPREP                            via split dose instruction. Scope In: 10:21:04 AM Scope Out: 10:40:50 AM Scope Withdrawal Time: 0 hours 10 minutes 26 seconds  Total Procedure Duration: 0 hours 19 minutes 46 seconds  Findings:                 The perianal and digital rectal examinations were                            normal. Pertinent negatives include normal                            sphincter tone. There was firmness of the left lobe  of the prostate, consistent with patient's known                            suspected prostate cancer.                           A 5 mm polyp was found in the transverse colon. The                            polyp was sessile. The polyp was removed with a                            cold snare. Resection and retrieval were complete.                            Estimated blood loss was minimal.                           The exam was otherwise normal throughout the                            examined colon.                           The retroflexed view of the distal rectum and anal                            verge was normal and showed no anal or rectal                            abnormalities. Complications:            No immediate complications. Estimated Blood Loss:     Estimated blood loss was minimal. Impression:                - One 5 mm polyp in the transverse colon, removed                            with a cold snare. Resected and retrieved.                           - The distal rectum and anal verge are normal on                            retroflexion view. Recommendation:           - Patient has a contact number available for                            emergencies. The signs and symptoms of potential                            delayed complications were discussed with the  patient. Return to normal activities tomorrow.                            Written discharge instructions were provided to the                            patient.                           - Resume previous diet.                           - Continue present medications.                           - Await pathology results.                           - Repeat colonoscopy (date not yet determined) for                            surveillance based on pathology results. Estefania Kamiya E. Tomasa Rand, MD 06/21/2022 10:47:35 AM This report has been signed electronically.

## 2022-06-21 NOTE — Progress Notes (Signed)
Report to PACU, RN, vss, BBS= Clear.  

## 2022-06-21 NOTE — Patient Instructions (Signed)
Handouts Provided:  Polyps  YOU HAD AN ENDOSCOPIC PROCEDURE TODAY AT THE [facility]:   Refer to the procedure report that was given to you for any specific questions about what was found during the examination.  If the procedure report does not answer your questions, please call your gastroenterologist to clarify.  If you requested that your care partner not be given the details of your procedure findings, then the procedure report has been included in a sealed envelope for you to review at your convenience later.  YOU SHOULD EXPECT: Some feelings of bloating in the abdomen. Passage of more gas than usual.  Walking can help get rid of the air that was put into your GI tract during the procedure and reduce the bloating. If you had a lower endoscopy (such as a colonoscopy or flexible sigmoidoscopy) you may notice spotting of blood in your stool or on the toilet paper. If you underwent a bowel prep for your procedure, you may not have a normal bowel movement for a few days.  Please Note:  You might notice some irritation and congestion in your nose or some drainage.  This is from the oxygen used during your procedure.  There is no need for concern and it should clear up in a day or so.  SYMPTOMS TO REPORT IMMEDIATELY:  Following lower endoscopy (colonoscopy or flexible sigmoidoscopy):  Excessive amounts of blood in the stool  Significant tenderness or worsening of abdominal pains  Swelling of the abdomen that is new, acute  Fever of 100F or higher  For urgent or emergent issues, a gastroenterologist can be reached at any hour by calling [phone]. Do not use MyChart messaging for urgent concerns.    DIET:  We do recommend a small meal at first, but then you may proceed to your regular diet.  Drink plenty of fluids but you should avoid alcoholic beverages for 24 hours.  ACTIVITY:  You should plan to take it easy for the rest of today and you should NOT DRIVE or use heavy  machinery until tomorrow (because of the sedation medicines used during the test).    FOLLOW UP: Our staff will call the number listed on your records the next business day following your procedure.  We will call around 7:15- 8:00 am to check on you and address any questions or concerns that you may have regarding the information given to you following your procedure. If we do not reach you, we will leave a message.     If any biopsies were taken you will be contacted by phone or by letter within the next 1-3 weeks.  Please call us at [phone] if you have not heard about the biopsies in 3 weeks.    SIGNATURES/CONFIDENTIALITY: You and/or your care partner have signed paperwork which will be entered into your electronic medical record.  These signatures attest to the fact that that the information above on your After Visit Summary has been reviewed and is understood.  Full responsibility of the confidentiality of this discharge information lies with you and/or your care-partner.  

## 2022-06-22 ENCOUNTER — Telehealth: Payer: Self-pay

## 2022-06-22 NOTE — Telephone Encounter (Signed)
  Follow up Call-     06/21/2022    9:39 AM  Call back number  Post procedure Call Back phone  # [phone]  Permission to leave phone message Yes     Patient questions:  Do you have a fever, pain , or abdominal swelling? No. Pain Score  0 *  Have you tolerated food without any problems? Yes.    Have you been able to return to your normal activities? Yes.    Do you have any questions about your discharge instructions: Diet   No. Medications  No. Follow up visit  No.  Do you have questions or concerns about your Care? No.  Actions: * If pain score is 4 or above: No action needed, pain <4.

## 2022-06-24 NOTE — Progress Notes (Signed)
Christopher Wells, The polyp which I removed during your recent procedure was proven to be completely benign but is considered a "pre-cancerous" polyp that MAY have grown into cancer if it had not been removed.  Studies shows that at least 20% of women over age 59 and 30% of men over age 29 have pre-cancerous polyps.  Based on current nationally recognized surveillance guidelines, I recommend that you have a repeat colonoscopy in 7 years.   If you develop any new rectal bleeding, abdominal pain or significant bowel habit changes, please contact me before then.

## 2022-07-06 ENCOUNTER — Ambulatory Visit (HOSPITAL_BASED_OUTPATIENT_CLINIC_OR_DEPARTMENT_OTHER)
Admission: RE | Admit: 2022-07-06 | Discharge: 2022-07-06 | Disposition: A | Discharge status: Home / Self Care | Payer: 59 | Source: Ambulatory Visit | Attending: Urology | Admitting: Urology

## 2022-07-06 ENCOUNTER — Other Ambulatory Visit: Payer: Self-pay | Admitting: Anatomic Pathology

## 2022-07-06 ENCOUNTER — Ambulatory Visit (INDEPENDENT_AMBULATORY_CARE_PROVIDER_SITE_OTHER): Payer: 59 | Admitting: Urology

## 2022-07-06 VITALS — BP 131/80 | HR 62

## 2022-07-06 DIAGNOSIS — C61 Malignant neoplasm of prostate: Secondary | ICD-10-CM | POA: Insufficient documentation

## 2022-07-06 DIAGNOSIS — R972 Elevated prostate specific antigen [PSA]: Secondary | ICD-10-CM | POA: Diagnosis present

## 2022-07-06 DIAGNOSIS — Z2989 Encounter for other specified prophylactic measures: Secondary | ICD-10-CM | POA: Diagnosis not present

## 2022-07-06 LAB — URINALYSIS, ROUTINE W REFLEX MICROSCOPIC
Bilirubin, UA: NEGATIVE
Glucose, UA: NEGATIVE
Ketones, UA: NEGATIVE
Leukocytes,UA: NEGATIVE
Nitrite, UA: NEGATIVE
Protein,UA: NEGATIVE
RBC, UA: NEGATIVE
Specific Gravity, UA: 1.025 (ref 1.005–1.030)
Urobilinogen, Ur: 0.2 mg/dL (ref 0.2–1.0)
pH, UA: 6 (ref 5.0–7.5)

## 2022-07-06 MED ORDER — CEFTRIAXONE SODIUM 1 G IJ SOLR
1.0000 g | Freq: Once | INTRAMUSCULAR | Status: AC
Start: 2022-07-06 — End: 2022-07-06
  Administered 2022-07-06: 1 g via INTRAMUSCULAR

## 2022-07-06 NOTE — Progress Notes (Signed)
IM Injection  Patient is present today for an IM Injection for treatment of infection prevention post prostate biopsy Drug: Ceftriaxone Dose:1g Location:RUOQ Lot: [id number] Exp:03/2024 Patient tolerated well, no complications were noted  Performed by: Twain Stenseth N., CMA(AAMA)    

## 2022-07-06 NOTE — Progress Notes (Signed)
Assessment: 1. Elevated PSA     Plan: Per bx instructions FU 1 week to review path and options  Chief Complaint: Elevated psa  HPI: Christopher Wells is a 57 y.o. male who presents for continued evaluation of elevated psa and fam hx of CaP. He is here today for planned TRUS/Bx Patient reports doing well.  Urinalysis today is clear.  He has completed standard prep and has received 1 g of Rocephin IM prior to the procedure.  Portions of the above documentation were copied from a prior visit for review purposes only.  Allergies: No Known Allergies  PMH: Past Medical History:  Diagnosis Date   Arthritis    DDD- lumbar   Heavy tobacco smoker    Lumbar degenerative disc disease    Recurrent upper respiratory infection (URI)    took z-pak, for URI- started 02/03/2011    PSH: Past Surgical History:  Procedure Laterality Date   ANTERIOR LUMBAR FUSION  02/11/2011   Procedure: ANTERIOR LUMBAR FUSION 1 LEVEL;  Surgeon: Javier Docker, MD;  Location: [facility] OR;  Service: Orthopedics;  Laterality: N/A;  ALIVL5-S1   BACK SURGERY     lumbar surgery- x3 prev. (first 2 in [place])    LUMBAR DISC SURGERY     x 3    SH: Social History   Tobacco Use   Smoking status: Former    Packs/day: 1.00    Years: 35.00    Additional pack years: 0.00    Total pack years: 35.00    Types: Cigarettes    Quit date: 10/05/2019    Years since quitting: 2.7   Smokeless tobacco: Former  Building services engineer Use: Never used  Substance Use Topics   Alcohol use: Not Currently    Comment: socially   Drug use: Never    ROS: Constitutional:  Negative for fever, chills, weight loss CV: Negative for chest pain, previous MI, hypertension Respiratory:  Negative for shortness of breath, wheezing, sleep apnea, frequent cough GI:  Negative for nausea, vomiting, bloody stool, GERD  PE: BP 131/80   Pulse 62  GENERAL APPEARANCE:  Well appearing, well developed, well nourished, NAD    Results: UA  clear   TRANSRECTAL ULTRASOUND AND PROSTATE BIOPSY  Indication:  Elevated PSA  Prophylactic antibiotic administration: Rocephin  All medications that could result in increased bleeding were discontinued within an appropriate period of the time of biopsy.  Risk including bleeding and infection were discussed.  Informed consent was obtained.  Preprocedural time out was performed  The patient was placed in the left lateral decubitus position.  DRE:  nst, prostate 30gm, generally firm but without focal nodules or induration  PROCEDURE 1.  TRANSRECTAL ULTRASOUND OF THE PROSTATE  The 7 MHz transrectal probe was used to image the prostate.  Anal stenosis was not noted.  TRUS volume: 24.9 ml  Hypoechoic areas: mid-gland on left  Hyperechoic areas: None  Central calcifications: not present  Margins:  normal  Seminal Vesicles: normal   PROCEDURE 2:  PROSTATE BIOPSY  A periprostatic block was performed using 1% lidocaine and transrectal ultrasound guidance. Under transrectal ultrasound guidance, and using the Biopty gun, prostate biopsies were obtained systematically from the apex, mid gland, and base bilaterally.  A total of 12 cores were obtained.  Hemostasis was obtained with gentle pressure on the prostate.  The procedures were well-tolerated.  No significant bleeding was noted at the end of the procedure.  The patient was stable for discharge from  the office.

## 2022-07-13 ENCOUNTER — Encounter: Payer: Self-pay | Admitting: Urology

## 2022-07-13 ENCOUNTER — Ambulatory Visit (INDEPENDENT_AMBULATORY_CARE_PROVIDER_SITE_OTHER): Payer: 59 | Admitting: Urology

## 2022-07-13 VITALS — BP 130/69 | HR 71

## 2022-07-13 DIAGNOSIS — C61 Malignant neoplasm of prostate: Secondary | ICD-10-CM | POA: Diagnosis not present

## 2022-07-13 NOTE — Progress Notes (Signed)
   Assessment: 1. Malignant neoplasm of prostate [facility])     Plan: Today had a long and detailed discussion with the patient and his wife regarding his high risk clinically localized prostate cancer including its natural history and standard treatment options.  I also discussed with him the NCCN guidelines for bone and soft tissue imaging for his high risk disease. I have recommended a PSMA PET scan as per guidelines Rationale as well as nature of test discussed in detail today. Patient will return after above for review of results and further recommendation based on findings   Chief Complaint: Prostate cancer  HPI: Christopher Wells is a 57 y.o. male who presents for follow-up today regarding his recently diagnosed high risk prostate cancer. Patient reports no complications as a result of his recent prostate biopsy.  Prostate cancer summary: Initial diagnosis: 07/2022 TRUS/BX--prostate volume = 25 mL Pathology: 6/6 left-sided biopsies positive for large volume disease--Highest Gleason-4+4 = 8, also present 4+3 = 7, 3+4 = 7 and 1 core 3+3 = 6 Pretreatment PSA = 24.3 Clinical stage--T1c/T2--firm left lobe and left side hypoechoic lesion on ultrasound  IPSS = 3 ED responds well to Viagra  Patient has a family history of prostate cancer and a brother who had surgical management   Portions of the above documentation were copied from a prior visit for review purposes only.  Allergies: No Known Allergies  PMH: Past Medical History:  Diagnosis Date   Arthritis    DDD- lumbar   Heavy tobacco smoker    Lumbar degenerative disc disease    Recurrent upper respiratory infection (URI)    took z-pak, for URI- started 02/03/2011    PSH: Past Surgical History:  Procedure Laterality Date   ANTERIOR LUMBAR FUSION  02/11/2011   Procedure: ANTERIOR LUMBAR FUSION 1 LEVEL;  Surgeon: Javier Docker, MD;  Location: [facility] OR;  Service: Orthopedics;  Laterality: N/A;  ALIVL5-S1   BACK SURGERY      lumbar surgery- x3 prev. (first 2 in [place])    LUMBAR DISC SURGERY     x 3    SH: Social History   Tobacco Use   Smoking status: Former    Packs/day: 1.00    Years: 35.00    Additional pack years: 0.00    Total pack years: 35.00    Types: Cigarettes    Quit date: 10/05/2019    Years since quitting: 2.7   Smokeless tobacco: Former  Building services engineer Use: Never used  Substance Use Topics   Alcohol use: Not Currently    Comment: socially   Drug use: Never    ROS: Constitutional:  Negative for fever, chills, weight loss CV: Negative for chest pain, previous MI, hypertension Respiratory:  Negative for shortness of breath, wheezing, sleep apnea, frequent cough GI:  Negative for nausea, vomiting, bloody stool, GERD  PE: Vitals:   07/13/22 1048  BP: 130/69  Pulse: 71    GENERAL APPEARANCE:  Well appearing, well developed, well nourished, NAD    Results: UA neg

## 2022-07-14 LAB — MICROSCOPIC EXAMINATION
Bacteria, UA: NONE SEEN
Cast Type: NONE SEEN
Casts: NONE SEEN /lpf
Crystal Type: NONE SEEN
Crystals: NONE SEEN
Epithelial Cells (non renal): NONE SEEN /hpf (ref 0–10)
Mucus, UA: NONE SEEN
Renal Epithel, UA: NONE SEEN /hpf
Trichomonas, UA: NONE SEEN
WBC, UA: NONE SEEN /hpf (ref 0–5)
Yeast, UA: NONE SEEN

## 2022-07-14 LAB — URINALYSIS, ROUTINE W REFLEX MICROSCOPIC
Bilirubin, UA: NEGATIVE
Glucose, UA: NEGATIVE
Ketones, UA: NEGATIVE
Leukocytes,UA: NEGATIVE
Nitrite, UA: NEGATIVE
Protein,UA: NEGATIVE
Specific Gravity, UA: 1.01 (ref 1.005–1.030)
Urobilinogen, Ur: 0.2 mg/dL (ref 0.2–1.0)
pH, UA: 6.5 (ref 5.0–7.5)

## 2022-08-03 ENCOUNTER — Encounter (HOSPITAL_COMMUNITY)
Admission: RE | Admit: 2022-08-03 | Discharge: 2022-08-03 | Disposition: A | Discharge status: Home / Self Care | Payer: 59 | Source: Ambulatory Visit | Attending: Urology | Admitting: Urology

## 2022-08-03 DIAGNOSIS — C61 Malignant neoplasm of prostate: Secondary | ICD-10-CM | POA: Diagnosis present

## 2022-08-03 MED ORDER — PIFLIFOLASTAT F 18 (PYLARIFY) INJECTION
9.0000 | Freq: Once | INTRAVENOUS | Status: AC
  Administered 2022-08-03: 9.3 via INTRAVENOUS

## 2022-08-10 ENCOUNTER — Ambulatory Visit (INDEPENDENT_AMBULATORY_CARE_PROVIDER_SITE_OTHER): Payer: 59 | Admitting: Urology

## 2022-08-10 ENCOUNTER — Encounter: Payer: Self-pay | Admitting: Urology

## 2022-08-10 VITALS — BP 126/80 | HR 56

## 2022-08-10 DIAGNOSIS — R911 Solitary pulmonary nodule: Secondary | ICD-10-CM

## 2022-08-10 DIAGNOSIS — C61 Malignant neoplasm of prostate: Secondary | ICD-10-CM | POA: Diagnosis not present

## 2022-08-10 NOTE — Progress Notes (Signed)
Assessment: 1. Malignant neoplasm of prostate (HCC)   2. Pulmonary nodule     Plan: Discussed incidental finding of nonspecific small pulmonary nodule and need for follow-up CT chest in 3 to 4 months.    Today I had a long and detailed discussion with the patient regarding the results of his PSMA PET scan which does not show evidence of extraprostatic disease.  Nonetheless, he has very high risk and we discussed treatments for high risk disease including surgery as well as combination RT plus ADT.  We discussed the pros and cons of each approach is not only in terms of oncologic outcomes but also in terms of potential adverse events, complications, as well as potential impact on quality of life parameters. Following our discussion today, I have recommended the patient be evaluated in our multidisciplinary GU oncology clinic in [place] with Dr. Laverle Patter and Dr. Kathrynn Running.  Will make arrangements for referral.    Chief Complaint: Prostate cancer  HPI: Christopher Wells is a 57 y.o. male who presents for continued evaluation of recently diagnosed high risk clinically localized prostate cancer. He is accompanied by his wife today.  Clinically he is doing very well without current complaints.   Baseline status-- Minimal lower urinary tract symptoms; IPSS = 3 Patient does have ED that responds well to Viagra.  Patient does have a family history of prostate cancer and a brother who required surgical management  Prostate cancer summary: Initial diagnosis: 07/2022 TRUS/BX--prostate volume = 25 mL Pathology: 6/6 left-sided biopsies positive for large volume disease--Highest Gleason-4+4 = 8, also present 4+3 = 7, 3+4 = 7 and 1 core 3+3 = 6 Pretreatment PSA = 24.3 Clinical stage--T1c/T2--firm left lobe and left side hypoechoic lesion on ultrasound   Metastatic evaluation--- PSMA PET 08/2022 IMPRESSION: 1. Nodular radiotracer avid focus along the posterior left prostate, compatible with primary  prostate neoplasm. 2. No evidence of radiotracer avid metastatic disease. 3. Irregular 6 mm right lower lobe pulmonary nodule and solid 4 mm left lower lobe pulmonary nodule favored sequela of infection/inflammation but nonspecific suggest attention on follow-up chest CT in 3 months. 4.  Aortic Atherosclerosis (ICD10-I70.0).   Portions of the above documentation were copied from a prior visit for review purposes only.  Allergies: No Known Allergies  PMH: Past Medical History:  Diagnosis Date   Arthritis    DDD- lumbar   Heavy tobacco smoker    Lumbar degenerative disc disease    Recurrent upper respiratory infection (URI)    took z-pak, for URI- started 02/03/2011    PSH: Past Surgical History:  Procedure Laterality Date   ANTERIOR LUMBAR FUSION  02/11/2011   Procedure: ANTERIOR LUMBAR FUSION 1 LEVEL;  Surgeon: Javier Docker, MD;  [facility];  Service: Orthopedics;  Laterality: N/A;  ALIVL5-S1   BACK SURGERY     lumbar surgery- x3 prev. (first 2 in [place])    LUMBAR DISC SURGERY     x 3    SH: Social History   Tobacco Use   Smoking status: Former    Current packs/day: 0.00    Average packs/day: 1 pack/day for 35.0 years (35.0 ttl pk-yrs)    Types: Cigarettes    Start date: 10/04/1984    Quit date: 10/05/2019    Years since quitting: 2.8   Smokeless tobacco: Former  Building services engineer status: Never Used  Substance Use Topics   Alcohol use: Not Currently    Comment: socially   Drug use: Never  ROS: Constitutional:  Negative for fever, chills, weight loss CV: Negative for chest pain, previous MI, hypertension Respiratory:  Negative for shortness of breath, wheezing, sleep apnea, frequent cough GI:  Negative for nausea, vomiting, bloody stool, GERD  PE: There were no vitals taken for this visit. GENERAL APPEARANCE:  Well appearing, well developed, well nourished, NAD    Results: UA neg

## 2022-08-12 ENCOUNTER — Telehealth: Payer: Self-pay

## 2022-08-12 NOTE — Telephone Encounter (Signed)
I called pt to introduce myself as the Coordinator of the Prostate [facility].   1. I confirmed with the patient he is aware of his referral to the clinic 8/27, arriving @ 12:15 pm.    2. I discussed the format of the clinic and the physicians he will be seeing that day.   3. I discussed where the clinic is located and how to contact me.   4. I confirmed his address and informed him I would be mailing a packet of information and forms to be completed. I asked him to bring them with him the day of his appointment.    He voiced understanding of the above. I asked him to call me if he has any questions or concerns regarding his appointments or the forms he needs to complete.

## 2022-08-31 ENCOUNTER — Inpatient Hospital Stay: Payer: 59 | Attending: Radiation Oncology | Admitting: Genetic Counselor

## 2022-08-31 ENCOUNTER — Encounter: Payer: Self-pay | Admitting: Radiation Oncology

## 2022-08-31 ENCOUNTER — Ambulatory Visit
Admission: RE | Admit: 2022-08-31 | Discharge: 2022-08-31 | Disposition: A | Discharge status: Home / Self Care | Payer: 59 | Source: Ambulatory Visit | Attending: Radiation Oncology | Admitting: Radiation Oncology

## 2022-08-31 ENCOUNTER — Encounter: Payer: Self-pay | Admitting: Urology

## 2022-08-31 VITALS — BP 105/68 | HR 63 | Temp 97.7°F | Resp 18 | Ht 73.0 in | Wt 149.4 lb

## 2022-08-31 DIAGNOSIS — C61 Malignant neoplasm of prostate: Secondary | ICD-10-CM

## 2022-08-31 DIAGNOSIS — Z8042 Family history of malignant neoplasm of prostate: Secondary | ICD-10-CM

## 2022-08-31 HISTORY — DX: Elevated prostate specific antigen (PSA): R97.20

## 2022-08-31 NOTE — Progress Notes (Signed)
                               Care Plan Summary  Name:  Devrin Athey" Svec  DOB: [birth year]   Your Medical Team:   Urologist -  Dr. Heloise Purpura, [facility]  Radiation Oncologist - Dr. Margaretmary Dys, [facility]     Recommendations: 1) Surgery     * These recommendations are based on information available as of today's consult.      Recommendations may change depending on the results of further tests or exams.    Next Steps: 1) [facility] will contact you with appointments for pre-op PT, and surgery date.     When appointments need to be scheduled, you will be contacted by [facility] and/or [facility].  Questions?  Please do not hesitate to call Cherlyn Cushing, BSN, RN at [phone] with any questions or concerns.  Marisue Ivan is your Oncology Nurse Navigator and is available to assist you while you're receiving your medical care at [facility].

## 2022-08-31 NOTE — Progress Notes (Signed)
Radiation Oncology         [phone] ________________________________  Multidisciplinary Prostate Cancer Clinic  Initial Radiation Oncology Consultation  Name: Christopher Wells MRN: [id number]  Date: 08/31/2022  DOB: Aug 11, 1965  WU:[id number], Selena Batten, DO  Heloise Purpura, MD   REFERRING PHYSICIAN: Heloise Purpura, MD  DIAGNOSIS: 57 y.o. gentleman with stage T1c/2 adenocarcinoma of the prostate with a Gleason's score of 4+4 and a PSA of 28.9    ICD-10-CM   1. Malignant neoplasm of prostate (HCC)  C61       HISTORY OF PRESENT ILLNESS::Christopher Wells is a 57 y.o. gentleman.  He was noted to have an elevated PSA of 24.3 on routine labs by his primary care physician, Dr. Ashley Royalty.  Accordingly, he was referred for evaluation in urology by Dr. Margo Aye on 06/15/22,  digital rectal examination performed at that time showed right-sided induration towards the base. A repeat PSA obtained that day showed a further increase to 28.9. The patient proceeded to transrectal ultrasound with 12 biopsies of the prostate on 07/06/22.  The prostate volume measured 24.9 cc.  Out of 12 core biopsies, 6 were positive, all on the left side.  The maximum Gleason score was 4+4, and this was seen in the apex and lateral apex. Additionally, Gleason 4+3 was seen in the mid and lateral base, Gleason 3+4 in the base, and Gleason 3+3 in the lateral mid.        He underwent PSMA PET scan for disease staging on 08/03/22 showing a nodular, radiotracer-avid focus along the posterior left prostate but no evidence of obvious radiotracer-avid metastatic disease. There was a nonspecific 6 mm RLL lung nodule and 4 mm LLL lung nodule, favored sequela of infection/inflammation.  However, since he was a previous heavy smoker, it is recommended that he have follow-up with a dedicated chest CT scan in 3 months.  The patient reviewed the biopsy results with his urologist and he has kindly been referred today to the multidisciplinary prostate  cancer clinic for presentation of pathology and radiology studies in our conference for discussion of potential radiation treatment options and clinical evaluation.  PREVIOUS RADIATION THERAPY: No  PAST MEDICAL HISTORY:  has a past medical history of Arthritis, Heavy tobacco smoker, Lumbar degenerative disc disease, and Recurrent upper respiratory infection (URI).    PAST SURGICAL HISTORY: Past Surgical History:  Procedure Laterality Date   ANTERIOR LUMBAR FUSION  02/11/2011   Procedure: ANTERIOR LUMBAR FUSION 1 LEVEL;  Surgeon: Javier Docker, MD;  Location: [facility];  Service: Orthopedics;  Laterality: N/A;  ALIVL5-S1   BACK SURGERY     lumbar surgery- x3 prev. (first 2 in [place])    LUMBAR DISC SURGERY     x 3    FAMILY HISTORY: family history includes Cancer in his brother; Dementia in his mother; Diabetes in his brother, brother, and mother; Heart disease in his father; Hyperlipidemia in his father; Hypertension in his father; Prostate cancer in his brother.  SOCIAL HISTORY:  reports that he quit smoking about 2 years ago. His smoking use included cigarettes. He started smoking about 37 years ago. He has a 35 pack-year smoking history. He has quit using smokeless tobacco. He reports that he does not currently use alcohol. He reports that he does not use drugs.  ALLERGIES: Patient has no known allergies.  MEDICATIONS:  Current Outpatient Medications  Medication Sig Dispense Refill   albuterol (VENTOLIN HFA) 108 (90 Base) MCG/ACT inhaler Inhale 1-2 puffs into the lungs every 4 (  four) hours as needed for shortness of breath (bronchospasm). 6.7 g 0   sildenafil (REVATIO) 20 MG tablet Take 1-5 tablets (20-100 mg total) by mouth as needed (sexual activity). Max 100 mg/day 50 tablet 3   No current facility-administered medications for this encounter.    REVIEW OF SYSTEMS:  On review of systems, the patient reports that he is doing well overall. He denies any chest pain, shortness of  breath, cough, fevers, chills, night sweats, unintended weight changes. He denies any bowel disturbances, and denies abdominal pain, nausea or vomiting. He denies any new musculoskeletal or joint aches or pains. His IPSS was 5, indicating mild urinary symptoms. His SHIM was 11, indicating he has moderate erectile dysfunction. A complete review of systems is obtained and is otherwise negative.   PHYSICAL EXAM:  Wt Readings from Last 3 Encounters:  08/31/22 149 lb 6 oz (67.8 kg)  06/15/22 150 lb (68 kg)  05/25/22 155 lb (70.3 kg)   Temp Readings from Last 3 Encounters:  08/31/22 97.7 F (36.5 C) (Temporal)  06/21/22 98.9 F (37.2 C)  05/06/20 98.7 F (37.1 C) (Oral)   BP Readings from Last 3 Encounters:  08/31/22 105/68  08/10/22 126/80  07/13/22 130/69   Pulse Readings from Last 3 Encounters:  08/31/22 63  08/10/22 (!) 56  07/13/22 71    /10  In general this is a well appearing Caucasian man in no acute distress. He's alert and oriented x4 and appropriate throughout the examination. Cardiopulmonary assessment is negative for acute distress and he exhibits normal effort.    KPS = 100  100 - Normal; no complaints; no evidence of disease. 90   - Able to carry on normal activity; minor signs or symptoms of disease. 80   - Normal activity with effort; some signs or symptoms of disease. 50   - Cares for self; unable to carry on normal activity or to do active work. 60   - Requires occasional assistance, but is able to care for most of his personal needs. 50   - Requires considerable assistance and frequent medical care. 40   - Disabled; requires special care and assistance. 30   - Severely disabled; hospital admission is indicated although death not imminent. 20   - Very sick; hospital admission necessary; active supportive treatment necessary. 10   - Moribund; fatal processes progressing rapidly. 0     - Dead  Karnofsky DA, Abelmann WH, Craver LS and Burchenal [facility] [phone]) The use  of the nitrogen mustards in the palliative treatment of carcinoma: with particular reference to bronchogenic carcinoma Cancer 1 [phone]   LABORATORY DATA:  Lab Results  Component Value Date   WBC 10.5 05/18/2022   HGB 15.1 05/18/2022   HCT 44.5 05/18/2022   MCV 85.4 05/18/2022   PLT 314 05/18/2022   Lab Results  Component Value Date   NA 137 05/18/2022   K 4.3 05/18/2022   CL 100 05/18/2022   CO2 29 05/18/2022   Lab Results  Component Value Date   ALT 13 05/18/2022   AST 14 05/18/2022   ALKPHOS 66 02/08/2011   BILITOT 0.5 05/18/2022     RADIOGRAPHY: NM PET (PSMA) SKULL TO MID THIGH  Result Date: 08/08/2022 CLINICAL DATA:  Prostate carcinoma high-risk staging. EXAM: NUCLEAR MEDICINE PET SKULL BASE TO THIGH TECHNIQUE: 9.3 mCi F18 Piflufolastat (Pylarify) was injected intravenously. Full-ring PET imaging was performed from the skull base to thigh after the radiotracer. CT data was obtained and used for  attenuation correction and anatomic localization. COMPARISON:  None Available. FINDINGS: NECK No radiotracer activity in neck lymph nodes. Incidental CT finding: None. CHEST No radiotracer accumulation within mediastinal or hilar lymph nodes. No radiotracer avid pulmonary nodules or masses. Incidental CT finding: Irregular 6 mm right lower lobe pulmonary nodule on image 39/7. Solid 4 mm left lower lobe pulmonary nodule on image 54/7. ABDOMEN/PELVIS Prostate: Nodular radiotracer avid focus along the posterior left prostate, max SUV of 18.7. Lymph nodes: No abnormal radiotracer accumulation within pelvic or abdominal nodes. Liver: No evidence of liver metastasis. Incidental CT finding: Aortic atherosclerosis. SKELETON No focal activity to suggest skeletal metastasis. Incidental CT finding: Multilevel degenerative changes spine. L5-S1 anterior fusion hardware IMPRESSION: 1. Nodular radiotracer avid focus along the posterior left prostate, compatible with primary prostate neoplasm. 2. No evidence  of radiotracer avid metastatic disease. 3. Irregular 6 mm right lower lobe pulmonary nodule and solid 4 mm left lower lobe pulmonary nodule favored sequela of infection/inflammation but nonspecific suggest attention on follow-up chest CT in 3 months. 4.  Aortic Atherosclerosis (ICD10-I70.0). Electronically Signed   By: Maudry Mayhew M.D.   On: 08/08/2022 09:24      IMPRESSION/PLAN: 57 y.o. gentleman with Stage T1c/2 adenocarcinoma of the prostate with a Gleason's score of 4+4 and a PSA of 28.9  We discussed the patient's workup and outlined the nature of prostate cancer in this setting. The patient's T stage, Gleason's score, and PSA put him into the high risk group. Accordingly, he is eligible for a variety of potential treatment options including prostatectomy or LT-ADT concurrent with either 8 weeks of external radiation or 5 weeks of external radiation with an upfront brachytherapy boost. We discussed the available radiation techniques, and focused on the details and logistics of delivery. We discussed and outlined the risks, benefits, short and long-term effects associated with radiotherapy and compared and contrasted these with prostatectomy. We discussed the role of SpaceOAR gel in reducing the rectal toxicity associated with radiotherapy. We also detailed the role of ADT in the treatment of high risk prostate cancer and outlined the associated side effects that could be expected with this therapy. He appears to have a good understanding of his disease and our treatment recommendations which are of curative intent.    The patient focused most of his questions and interest in robotic-assisted laparoscopic radical prostatectomy.  We discussed some of the potential advantages of surgery including surgical staging, the availability of salvage radiotherapy to the prostatic fossa, and the confidence associated with immediate biochemical response.  We discussed some of the potential proven indications for  postoperative radiotherapy including positive margins, extracapsular extension, and seminal vesicle involvement. We also talked about some of the other potential findings leading to a recommendation for radiotherapy including a non-zero postoperative PSA and positive lymph nodes. He was encouraged to ask questions that were answered to his stated satisfaction.  At the end of the conversation the patient is interested in moving forward with robotic assisted laparoscopic prostatectomy so we will share our discussion with Dr. Margo Aye and Dr. Laverle Patter so that they can move forward with surgical scheduling.  We enjoyed meeting him and his wife today and look forward to following his progress.  Of course, if there is any clinical indication for radiotherapy in the future, we would be more than happy to continue to participate in his care.  They know that they are welcome to call at anytime with any questions or concerns related to the treatment options discussed today.  We personally spent 60 minutes in this encounter including chart review, reviewing radiological studies, meeting face-to-face with the patient, entering orders and completing documentation.    Marguarite Arbour, PA-C    Margaretmary Dys, MD  [facility]  Radiation Oncology Direct Dial: [phone]  Fax: [phone] [email or web address]  Skype  [email or web address]   This document serves as a record of services personally performed by Margaretmary Dys, MD and Marcello Fennel, PA-C. It was created on their behalf by Mickie Bail, a trained medical scribe. The creation of this record is based on the scribe's personal observations and the provider's statements to them. This document has been checked and approved by the attending provider.

## 2022-08-31 NOTE — Consult Note (Signed)
Multi-Disciplinary Clinic     08/31/2022   --------------------------------------------------------------------------------   Christopher Wells  MRN: [id number]  DOB: [birth year], 57 year old Male  SSN:    PRIMARY CARE:     REFERRING:  Roslyn Smiling  PROVIDER:  Heloise Purpura, M.D.  LOCATION:  [facility] [phone]     --------------------------------------------------------------------------------   CC/HPI: CC: Prostate Cancer   Physician requesting consult: Dr. Shelby Dubin  PCP: Dr. Everrett Coombe  Location of consult: [facility] - Prostate Cancer Multidisciplinary [facility]   Christopher Wells is a 57 year old healthy gentleman who was found to have a persistently elevated PSA of 28.9. This prompted a TRUS biopsy of the prostate on 07/06/2022 by Dr. 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 All left-sided specimens were positive.   Family history: He had a brother with prostate cancer underwent surgical therapy at age 85.   Imaging studies: [facility] pet imaging (08/08/2022) -no evidence of metastatic disease. He did incidentally have small subcentimeter bilateral pulmonary nodules. These did not demonstrate uptake.   PMH: He has a history of arthritis.  PSH: He has undergone a lumbar fusion that was performed via an anterior approach by Dr. Jene Every. This was performed via an anterior but retroperitoneal approach.   TNM stage: cT2 N0 M0  PSA: 28.9  Gleason score: 4+4 = 8 (grade group 4)  Biopsy (07/06/2022): 6/12 cores positive  Left: Left lateral apex (50%, 4+4 = 8), left apex (50%, 4+4 = 8), left lateral mid (2%, 3+3 = 6), left mid (28%, 4+3 = 7), left lateral base (90%, 4+3 = 7), left base (37%, 3+4 = 7)  Right: Benign  Prostate volume: 24.9 cc   Nomogram  OC disease: 14%  EPE: 83%  SVI: 30%  LNI: 35%  PFS (5 year, 10 year): 23%, 13%   Urinary function: IPSS is 5.  Erectile function:  SHIM score is 11.     ALLERGIES: No Allergies    MEDICATIONS: No Medications    GU PSH: None   NON-GU PSH: LUMBAR ARTIF DISKECTOMY Lumbar Spine Fusion     GU PMH: No GU PMH    NON-GU PMH: No Non-GU PMH    FAMILY HISTORY: Prostate Cancer - Brother   SOCIAL HISTORY: Marital Status: Unknown Current Smoking Status: Patient does not smoke anymore. Smoked for 40 years. Smoked 1 pack per day.   Tobacco Use Assessment Completed: Used Tobacco in last 30 days?    REVIEW OF SYSTEMS:    GU Review Male:   Patient denies frequent urination, hard to postpone urination, burning/ pain with urination, get up at night to urinate, leakage of urine, stream starts and stops, trouble starting your streams, and have to strain to urinate .  Gastrointestinal (Upper):   Patient denies nausea and vomiting.  Gastrointestinal (Lower):   Patient denies diarrhea and constipation.  Constitutional:   Patient denies fever, night sweats, weight loss, and fatigue.  Skin:   Patient denies skin rash/ lesion and itching.  Eyes:   Patient denies blurred vision and double vision.  Ears/ Nose/ Throat:   Patient denies sore throat and sinus problems.  Hematologic/Lymphatic:   Patient denies swollen glands and easy bruising.  Cardiovascular:   Patient denies leg swelling and chest pains.  Respiratory:   Patient denies cough and shortness of breath.  Endocrine:   Patient denies excessive thirst.  Musculoskeletal:   Patient denies  back pain and joint pain.  Neurological:   Patient denies headaches and dizziness.  Psychologic:   Patient denies depression and anxiety.   VITAL SIGNS: None   GU PHYSICAL EXAMINATION:    Prostate: Prostate about 40 grams. Left lobe normal consistency, right lobe normal consistency. Symmetrical lobes. No prostate nodule. Left lobe no tenderness, right lobe no tenderness. I do not necessarily feel significant induration or nodularity on his prostate.   MULTI-SYSTEM PHYSICAL EXAMINATION:     Constitutional: Well-nourished. No physical deformities. Normally developed. Good grooming.  Respiratory: No labored breathing, no use of accessory muscles. Clear bilaterally.  Cardiovascular: Normal temperature, normal extremity pulses, no swelling, no varicosities. Regular rate and rhythm.  Gastrointestinal: No mass, no tenderness, no rigidity, non obese abdomen.      Complexity of Data:  Lab Test Review:   PSA  Records Review:   Pathology Reports, Previous Patient Records  X-Ray Review: PET- PS[facility] Scan: Reviewed Films.    Notes:                     CLINICAL DATA: Prostate carcinoma high-risk staging.   EXAM:  NUCLEAR MEDICINE PET SKULL BASE TO THIGH   TECHNIQUE:  9.3 mCi F18 Piflufolastat (Pylarify) was injected intravenously.  Full-ring PET imaging was performed from the skull base to thigh  after the radiotracer. CT data was obtained and used for attenuation  correction and anatomic localization.   COMPARISON: None Available.   FINDINGS:  NECK   No radiotracer activity in neck lymph nodes.   Incidental CT finding: None.   CHEST   No radiotracer accumulation within mediastinal or hilar lymph nodes.  No radiotracer avid pulmonary nodules or masses.   Incidental CT finding: Irregular 6 mm right lower lobe pulmonary  nodule on image 39/7. Solid 4 mm left lower lobe pulmonary nodule on  image 54/7.   ABDOMEN/PELVIS   Prostate: Nodular radiotracer avid focus along the posterior left  prostate, max SUV of 18.7.Marland Kitchen   Lymph nodes: No abnormal radiotracer accumulation within pelvic or  abdominal nodes.   Liver: No evidence of liver metastasis.   Incidental CT finding: Aortic atherosclerosis.   SKELETON   No focal activity to suggest skeletal metastasis.   Incidental CT finding: Multilevel degenerative changes spine. L5-S1  anterior fusion hardware   IMPRESSION:  1. Nodular radiotracer avid focus along the posterior left prostate,  compatible with primary  prostate neoplasm.  2. No evidence of radiotracer avid metastatic disease.  3. Irregular 6 mm right lower lobe pulmonary nodule and solid 4 mm  left lower lobe pulmonary nodule favored sequela of  infection/inflammation but nonspecific suggest attention on  follow-up chest CT in 3 months.  4. Aortic Atherosclerosis (ICD10-I70.0).    Electronically Signed  By: Maudry Mayhew M.D.  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 High risk prostate cancer: Mr. Mccoig has met with Dr. Kathrynn Running today and I had a long discussion with him and his wife as well. The patient was counseled about the natural history of prostate cancer and the standard treatment options that are available  for prostate cancer. It was explained to him how his age and life expectancy, clinical stage, Gleason score/prognostic grade group, and PSA (and PSA density) affect his prognosis, the decision to proceed with additional staging studies, as well as how that information influences recommended treatment strategies. We discussed the roles for active surveillance, radiation therapy, surgical therapy, androgen deprivation, as well as ablative therapy and other investigational options for the treatment of prostate cancer as appropriate to his individual cancer situation. We discussed the risks and benefits of these options with regard to their impact on cancer control and also in terms of potential adverse events, complications, and impact on quality of life particularly related to urinary and sexual function. The patient was encouraged to ask questions throughout the discussion today and all questions were answered to his stated satisfaction. In addition, the patient was provided with and/or  directed to appropriate resources and literature for further education about prostate cancer and treatment options. We discussed surgical therapy for prostate cancer including the different available surgical approaches. We discussed, in detail, the risks and expectations of surgery with regard to cancer control, urinary control, and erectile function as well as the expected postoperative recovery process. Additional risks of surgery including but not limited to bleeding, infection, hernia formation, nerve damage, lymphocele formation, bowel/rectal injury potentially necessitating colostomy, damage to the urinary tract resulting in urine leakage, urethral stricture, and the cardiopulmonary risks such as myocardial infarction, stroke, death, venothromboembolism, etc. were explained. The risk of open surgical conversion for robotic/laparoscopic prostatectomy was also discussed.   He does adamantly wish to proceed with primary surgical therapy. He understands the possible need for multimodality treatment considering his high risk disease. Considering his large volume disease in the left side of the prostate and his pre-existing erectile dysfunction, I have recommended that he undergo unilateral right nerve sparing robot-assisted laparoscopic radical prostatectomy and bilateral pelvic lymphadenectomy.   2. Nonspecific pulmonary nodules: Considering his smoking history, I have recommended that we repeat a CT scan of the chest in the next 3 to 6 months for further evaluation.   CC: Dr. Shelby Dubin  Dr. Everrett Coombe  Dr. Margaretmary Dys    E & M CODES: We spent 68 minutes dedicated to evaluation and management time, including face to face interaction, discussions on coordination of care, documentation, result review, and discussion with others as applicable.

## 2022-09-02 ENCOUNTER — Encounter: Payer: Self-pay | Admitting: Genetic Counselor

## 2022-09-02 NOTE — Progress Notes (Signed)
REFERRING PROVIDER: Margaretmary Dys, MD  PRIMARY PROVIDER:  Everrett Coombe, DO  PRIMARY REASON FOR VISIT:  1. Malignant neoplasm of prostate (HCC)   2. Family history of prostate cancer    HISTORY OF PRESENT ILLNESS:   Mr. Brandenberger, a 57 y.o. male, was seen for a [employer] cancer genetics consultation at the request of Dr. Kathrynn Running due to a personal and family history of cancer.  Mr. Yamamoto presents to clinic today to discuss the possibility of a hereditary predisposition to cancer, to discuss genetic testing, and to further clarify his future cancer risks, as well as potential cancer risks for family members.   Mr. Devitt was diagnosed with prostate cancer at age 51 (Gleason score: 8).   CANCER HISTORY:  Oncology History  Malignant neoplasm of prostate (HCC)  07/06/2022 Cancer Staging   Staging form: Prostate, AJCC 8th Edition - Clinical stage from 07/06/2022: Stage IIIA (cT2a, cN0, cM0, PSA: 24.3, Grade Group: 4) - Signed by Marcello Fennel, PA-C on 08/31/2022 Histopathologic type: Adenocarcinoma, NOS Stage prefix: Initial diagnosis Prostate specific antigen (PSA) range: 20 or greater Gleason primary pattern: 4 Gleason secondary pattern: 4 Gleason score: 8 Histologic grading system: 5 grade system Number of biopsy cores examined: 12 Number of biopsy cores positive: 6 Location of positive needle core biopsies: One side   08/31/2022 Initial Diagnosis   Malignant neoplasm of prostate [facility])     Past Medical History:  Diagnosis Date   Arthritis    DDD- lumbar   Elevated PSA    Heavy tobacco smoker    Lumbar degenerative disc disease    Recurrent upper respiratory infection (URI)    took z-pak, for URI- started 02/03/2011    Past Surgical History:  Procedure Laterality Date   ANTERIOR LUMBAR FUSION  02/11/2011   Procedure: ANTERIOR LUMBAR FUSION 1 LEVEL;  Surgeon: Javier Docker, MD;  Location: [facility] OR;  Service: Orthopedics;  Laterality: N/A;  ALIVL5-S1   BACK SURGERY      lumbar surgery- x3 prev. (first 2 in [place])    LUMBAR DISC SURGERY     x 3   PROSTATE BIOPSY      Social History   Socioeconomic History   Marital status: Married    Spouse name: Kaman Fronda   Number of children: 9   Years of education: Not on file   Highest education level: Not on file  Occupational History   Not on file  Tobacco Use   Smoking status: Former    Current packs/day: 0.00    Average packs/day: 1 pack/day for 35.0 years (35.0 ttl pk-yrs)    Types: Cigarettes    Start date: 10/04/1984    Quit date: 10/05/2019    Years since quitting: 2.9   Smokeless tobacco: Former  Building services engineer status: Never Used  Substance and Sexual Activity   Alcohol use: Not Currently    Comment: socially   Drug use: Never   Sexual activity: Yes    Birth control/protection: None  Other Topics Concern   Not on file  Social History Narrative   Not on file   Social Determinants of Health   Financial Resource Strain: Not on file  Food Insecurity: No Food Insecurity (08/31/2022)   Hunger Vital Sign    Worried About Running Out of Food in the Last Year: Never true    Ran Out of Food in the Last Year: Never true  Transportation Needs: No Transportation Needs (08/31/2022)   PRAPARE - Transportation  Lack of Transportation (Medical): No    Lack of Transportation (Non-Medical): No  Physical Activity: Not on file  Stress: Not on file  Social Connections: Unknown (05/17/2021)   Received from [facility]   Social Network    Social Network: Not on file     FAMILY HISTORY:  We obtained a detailed, 4-generation family history.  Significant diagnoses are listed below: Family History  Problem Relation Age of Onset   Diabetes Mother    Dementia Mother    Cervical cancer Mother    Hyperlipidemia Father    Hypertension Father    Heart disease Father    Diabetes Brother    Diabetes Brother    Prostate cancer Brother 3   Anesthesia problems Neg Hx    Hypotension Neg  Hx    Malignant hyperthermia Neg Hx    Pseudochol deficiency Neg Hx    Colon cancer Neg Hx    Colon polyps Neg Hx    Esophageal cancer Neg Hx    Stomach cancer Neg Hx    Rectal cancer Neg Hx      Mr. Mcpartland is unaware of previous family history of genetic testing for hereditary cancer risks. There is no reported Ashkenazi Jewish ancestry.   GENETIC COUNSELING ASSESSMENT: Mr. Winterberg is a 57 y.o. male with a personal and family history of cancer which is somewhat suggestive of a hereditary predisposition to cancer given his high risk prostate cancer. We, therefore, discussed and recommended the following at today's visit.   DISCUSSION: We discussed that 5 - 10% of cancer is hereditary, with most cases of prostate cancer associated with BRCA1/2.  There are other genes that can be associated with hereditary prostate cancer syndromes.  We discussed that testing is beneficial for several reasons including knowing how to follow individuals after completing their treatment, identifying whether potential treatment options would be beneficial, and understanding if other family members could be at risk for cancer and allowing them to undergo genetic testing.   We reviewed the characteristics, features and inheritance patterns of hereditary cancer syndromes. We also discussed genetic testing, including the appropriate family members to test, the process of testing, insurance coverage and turn-around-time for results. We discussed the implications of a negative, positive, carrier and/or variant of uncertain significant result. We recommended Mr. Caughell pursue genetic testing for a panel that includes genes associated with prostate cancer.   Based on Mr. Tritten personal and family history of cancer, he meets medical criteria for genetic testing. Despite that he meets criteria, he may still have an out of pocket cost. We discussed that if his out of pocket cost for testing is over $100, the laboratory will call  and confirm whether he wants to proceed with testing.  If the out of pocket cost of testing is less than $100 he will be billed by the genetic testing laboratory.   PLAN: Despite our recommendation, Mr. Degon did not wish to pursue genetic testing at today's visit. We understand this decision and remain available to coordinate genetic testing at any time in the future. We, therefore, recommend Mr. Kulpa continue to follow the cancer screening guidelines given by his primary healthcare provider.  Mr. Elm questions were answered to his satisfaction today. Our contact information was provided should additional questions or concerns arise. Thank you for the referral and allowing [place] to share in the care of your patient.   Lalla Brothers, MS, [facility] Genetic Counselor [email or web address] (P) [phone]  The patient was seen for a  total of <15 minutes in face-to-face genetic counseling.  The patient brought his wife. Drs. Pamelia Hoit and/or Mosetta Putt were available to discuss this case as needed.   _______________________________________________________________________ For Office Staff:  Number of people involved in session: 2 Was an Intern/ student involved with case: no

## 2022-09-10 NOTE — Progress Notes (Signed)
RN spoke with patient to confirm that we are still awaiting surgery date at this time.  Patient is interested in applying for short term disability.  RN provided patient with contact name/number at [employer] that can help assist with that. No additional questions at this time.  Will continue to follow to ensure surgery is scheduled.

## 2022-09-13 ENCOUNTER — Other Ambulatory Visit: Payer: Self-pay | Admitting: Urology

## 2022-09-23 LAB — SURGICAL PATHOLOGY

## 2022-11-08 NOTE — Progress Notes (Signed)
COVID Vaccine Completed:  Date of COVID positive in last 90 days:  PCP - Everrett Coombe, DO Cardiologist -   PET- 08/03/22 Epic Chest x-ray -  EKG -  Stress Test -  ECHO -  Cardiac Cath -  Pacemaker/ICD device last checked: Spinal Cord Stimulator:  Bowel Prep -   Sleep Study -  CPAP -   Fasting Blood Sugar -  Checks Blood Sugar _____ times a day  Last dose of GLP1 agonist-  N/A GLP1 instructions:  N/A   Last dose of SGLT-2 inhibitors-  N/A SGLT-2 instructions: N/A   Blood Thinner Instructions:  Time Aspirin Instructions: Last Dose:  Activity level:  Can go up a flight of stairs and perform activities of daily living without stopping and without symptoms of chest pain or shortness of breath.  Able to exercise without symptoms  Unable to go up a flight of stairs without symptoms of     Anesthesia review:   Patient denies shortness of breath, fever, cough and chest pain at PAT appointment  Patient verbalized understanding of instructions that were given to them at the PAT appointment. Patient was also instructed that they will need to review over the PAT instructions again at home before surgery.

## 2022-11-09 ENCOUNTER — Other Ambulatory Visit: Payer: Self-pay

## 2022-11-09 ENCOUNTER — Telehealth: Payer: Self-pay | Admitting: Family Medicine

## 2022-11-09 ENCOUNTER — Encounter (HOSPITAL_COMMUNITY)
Admission: RE | Admit: 2022-11-09 | Discharge: 2022-11-09 | Disposition: A | Discharge status: Home / Self Care | Payer: 59 | Source: Ambulatory Visit | Attending: Urology | Admitting: Urology

## 2022-11-09 ENCOUNTER — Encounter (HOSPITAL_COMMUNITY): Payer: Self-pay

## 2022-11-09 VITALS — BP 116/67 | HR 72 | Temp 98.7°F | Resp 16 | Ht 73.0 in | Wt 147.0 lb

## 2022-11-09 DIAGNOSIS — Z01812 Encounter for preprocedural laboratory examination: Secondary | ICD-10-CM | POA: Diagnosis present

## 2022-11-09 DIAGNOSIS — Z01818 Encounter for other preprocedural examination: Secondary | ICD-10-CM | POA: Diagnosis not present

## 2022-11-09 DIAGNOSIS — Z01810 Encounter for preprocedural cardiovascular examination: Secondary | ICD-10-CM | POA: Diagnosis present

## 2022-11-09 HISTORY — DX: Other complications of anesthesia, initial encounter: T88.59XA

## 2022-11-09 HISTORY — DX: Chronic obstructive pulmonary disease, unspecified: J44.9

## 2022-11-09 HISTORY — DX: Prediabetes: R73.03

## 2022-11-09 LAB — BASIC METABOLIC PANEL
Anion gap: 8 (ref 5–15)
BUN: 18 mg/dL (ref 6–20)
CO2: 27 mmol/L (ref 22–32)
Calcium: 9.1 mg/dL (ref 8.9–10.3)
Chloride: 103 mmol/L (ref 98–111)
Creatinine, Ser: 1.14 mg/dL (ref 0.61–1.24)
GFR, Estimated: >60 mL/min (ref >60)
Glucose, Bld: 93 mg/dL (ref 70–99)
Potassium: 4 mmol/L (ref 3.5–5.1)
Sodium: 138 mmol/L (ref 135–145)

## 2022-11-09 LAB — CBC
HCT: 43.5 % (ref 39.0–52.0)
Hemoglobin: 14.5 g/dL (ref 13.0–17.0)
MCH: 29.6 pg (ref 26.0–34.0)
MCHC: 33.3 g/dL (ref 30.0–36.0)
MCV: 88.8 fL (ref 80.0–100.0)
Platelets: 323 10*3/uL (ref 150–400)
RBC: 4.9 MIL/uL (ref 4.22–5.81)
RDW: 12 % (ref 11.5–15.5)
WBC: 9.2 10*3/uL (ref 4.0–10.5)
nRBC: 0 % (ref 0.0–0.2)

## 2022-11-09 NOTE — Telephone Encounter (Signed)
Patient called in stating that he is having surgery on 11/15/22

## 2022-11-09 NOTE — Patient Instructions (Signed)
SURGICAL WAITING ROOM VISITATION  Patients having surgery or a procedure may have no more than 2 support people in the waiting area - these visitors may rotate.    Children under the age of 33 must have an adult with them who is not the patient.  Due to an increase in RSV and influenza rates and associated hospitalizations, children ages 1 and under may not visit patients in [facility] hospitals.  If the patient needs to stay at the hospital during part of their recovery, the visitor guidelines for inpatient rooms apply. Pre-op nurse will coordinate an appropriate time for 1 support person to accompany patient in pre-op.  This support person may not rotate.    Please refer to the [facility] website for the visitor guidelines for Inpatients (after your surgery is over and you are in a regular room).    Your procedure is scheduled on: 11/15/22   Report to [facility] Main Entrance    Report to admitting at 9:00 AM   Call this number if you have problems the morning of surgery [phone]   Follow a clear liquid diet the day before surgery.  Water Non-Citrus Juices (without pulp, NO RED-Apple, White grape, White cranberry) Black Coffee (NO MILK/CREAM OR CREAMERS, sugar ok)  Clear Tea (NO MILK/CREAM OR CREAMERS, sugar ok) regular and decaf                             Plain Jell-O (NO RED)                                           Fruit ices (not with fruit pulp, NO RED)                                     Popsicles (NO RED)                                                               Sports drinks like Gatorade (NO RED)               Nothing to drink after midnight.          If you have questions, please contact your surgeon's office.   FOLLOW BOWEL PREP AND ANY ADDITIONAL PRE OP INSTRUCTIONS YOU RECEIVED FROM YOUR SURGEON'S OFFICE!!!     Oral Hygiene is also important to reduce your risk of infection.                                    Remember - BRUSH YOUR TEETH  THE MORNING OF SURGERY WITH YOUR REGULAR TOOTHPASTE  DENTURES WILL BE REMOVED PRIOR TO SURGERY PLEASE DO NOT APPLY "Poly grip" OR ADHESIVES!!!   Stop all vitamins and herbal supplements 7 days before surgery.   Take these medicines the morning of surgery with A SIP OF WATER: Tylenol or Inhaler if needed  You may not have any metal on your body including jewelry, and body piercing             Do not wear lotions, powders, cologne, or deodorant              Men may shave face and neck.   Do not bring valuables to the hospital. [facility] IS NOT             RESPONSIBLE   FOR VALUABLES.   Contacts, glasses, dentures or bridgework may not be worn into surgery.   Bring small overnight bag day of surgery.   DO NOT BRING YOUR HOME MEDICATIONS TO THE HOSPITAL. PHARMACY WILL DISPENSE MEDICATIONS LISTED ON YOUR MEDICATION LIST TO YOU DURING YOUR ADMISSION IN THE HOSPITAL!              Please read over the following fact sheets you were given: IF YOU HAVE QUESTIONS ABOUT YOUR PRE-OP INSTRUCTIONS PLEASE CALL [name]    If you received a COVID test during your pre-op visit  it is requested that you wear a mask when out in public, stay away from anyone that may not be feeling well and notify your surgeon if you develop symptoms. If you test positive for Covid or have been in contact with anyone that has tested positive in the last 10 days please notify you surgeon.    [facility] - Preparing for Surgery Before surgery, you can play an important role.  Because skin is not sterile, your skin needs to be as free of germs as possible.  You can reduce the number of germs on your skin by washing with CHG (chlorahexidine gluconate) soap before surgery.  CHG is an antiseptic cleaner which kills germs and bonds with the skin to continue killing germs even after washing. Please DO NOT use if you have an allergy to CHG or antibacterial soaps.  If your skin becomes  reddened/irritated stop using the CHG and inform your nurse when you arrive at Short Stay. Do not shave (including legs and underarms) for at least 48 hours prior to the first CHG shower.  You may shave your face/neck.  Please follow these instructions carefully:  1.  Shower with CHG Soap the night before surgery and the  morning of surgery.  2.  If you choose to wash your hair, wash your hair first as usual with your normal  shampoo.  3.  After you shampoo, rinse your hair and body thoroughly to remove the shampoo.                             4.  Use CHG as you would any other liquid soap.  You can apply chg directly to the skin and wash.  Gently with a scrungie or clean washcloth.  5.  Apply the CHG Soap to your body ONLY FROM THE NECK DOWN.   Do   not use on face/ open                           Wound or open sores. Avoid contact with eyes, ears mouth and   genitals (private parts).                       Wash face,  Genitals (private parts) with your normal soap.  6.  Wash thoroughly, paying special attention to the area where your    surgery  will be performed.  7.  Thoroughly rinse your body with warm water from the neck down.  8.  DO NOT shower/wash with your normal soap after using and rinsing off the CHG Soap.                9.  Pat yourself dry with a clean towel.            10.  Wear clean pajamas.            11.  Place clean sheets on your bed the night of your first shower and do not  sleep with pets. Day of Surgery : Do not apply any lotions/deodorants the morning of surgery.  Please wear clean clothes to the hospital/surgery center.  FAILURE TO FOLLOW THESE INSTRUCTIONS MAY RESULT IN THE CANCELLATION OF YOUR SURGERY  PATIENT SIGNATURE_________________________________  NURSE SIGNATURE__________________________________  ________________________________________________________________________ WHAT IS A BLOOD TRANSFUSION? Blood Transfusion Information  A transfusion  is the replacement of blood or some of its parts. Blood is made up of multiple cells which provide different functions. Red blood cells carry oxygen and are used for blood loss replacement. White blood cells fight against infection. Platelets control bleeding. Plasma helps clot blood. Other blood products are available for specialized needs, such as hemophilia or other clotting disorders. BEFORE THE TRANSFUSION  Who gives blood for transfusions?  Healthy volunteers who are fully evaluated to make sure their blood is safe. This is blood bank blood. Transfusion therapy is the safest it has ever been in the practice of medicine. Before blood is taken from a donor, a complete history is taken to make sure that person has no history of diseases nor engages in risky social behavior (examples are intravenous drug use or sexual activity with multiple partners). The donor's travel history is screened to minimize risk of transmitting infections, such as malaria. The donated blood is tested for signs of infectious diseases, such as HIV and hepatitis. The blood is then tested to be sure it is compatible with you in order to minimize the chance of a transfusion reaction. If you or a relative donates blood, this is often done in anticipation of surgery and is not appropriate for emergency situations. It takes many days to process the donated blood. RISKS AND COMPLICATIONS Although transfusion therapy is very safe and saves many lives, the main dangers of transfusion include:  Getting an infectious disease. Developing a transfusion reaction. This is an allergic reaction to something in the blood you were given. Every precaution is taken to prevent this. The decision to have a blood transfusion has been considered carefully by your caregiver before blood is given. Blood is not given unless the benefits outweigh the risks. AFTER THE TRANSFUSION Right after receiving a blood transfusion, you will usually feel much  better and more energetic. This is especially true if your red blood cells have gotten low (anemic). The transfusion raises the level of the red blood cells which carry oxygen, and this usually causes an energy increase. The nurse administering the transfusion will monitor you carefully for complications. HOME CARE INSTRUCTIONS  No special instructions are needed after a transfusion. You may find your energy is better. Speak with your caregiver about any limitations on activity for underlying diseases you may have. SEEK MEDICAL CARE IF:  Your condition is not improving after your transfusion. You develop redness or irritation at the intravenous (  IV) site. SEEK IMMEDIATE MEDICAL CARE IF:  Any of the following symptoms occur over the next 12 hours: Shaking chills. You have a temperature by mouth above 102 F (38.9 C), not controlled by medicine. Chest, back, or muscle pain. People around you feel you are not acting correctly or are confused. Shortness of breath or difficulty breathing. Dizziness and fainting. You get a rash or develop hives. You have a decrease in urine output. Your urine turns a dark color or changes to pink, red, or brown. Any of the following symptoms occur over the next 10 days: You have a temperature by mouth above 102 F (38.9 C), not controlled by medicine. Shortness of breath. Weakness after normal activity. The white part of the eye turns yellow (jaundice). You have a decrease in the amount of urine or are urinating less often. Your urine turns a dark color or changes to pink, red, or brown. Document Released: 12/19/1999 Document Revised: 03/15/2011 Document Reviewed: 08/07/2007 [facility] Patient Information 2014 [place], [place].  _______________________________________________________________________

## 2022-11-12 NOTE — H&P (Signed)
Office Visit Report     11/09/2022   --------------------------------------------------------------------------------   Christopher Wells  MRN: [id number]  DOB: 24-Mar-1965, 57 year old Male  SSN:    PRIMARY CARE:  Mammie Lorenzo, DO  PRIMARY CARE FAX:  [phone]  REFERRING:  Roslyn Smiling  PROVIDER:  Heloise Purpura, M.D.  TREATING:  Ulyses Amor, [place]  LOCATION:  [facility] [phone]     --------------------------------------------------------------------------------   CC/HPI: Pt presents today for pre-operative history and physical exam in anticipation of robotic assisted lap radical prostatectomy with bilateral pelvic lymph node dissection by Dr. Laverle Patter on 11/15/22. He is doing well and is without complaint.   Pt denies F/C, HA, CP, SOB, N/V, diarrhea/constipation, back pain, flank pain, hematuria, and dysuria.     HX:   CC: Prostate Cancer   Physician requesting consult: Dr. Shelby Dubin  PCP: Dr. Everrett Coombe  Location of consult: [facility] - [facility]   Christopher Wells is a 57 year old healthy gentleman who was found to have a persistently elevated PSA of 28.9. This prompted a TRUS biopsy of the prostate on 07/06/2022 by Dr. 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 All left-sided specimens were positive.   Family history: He had a brother with prostate cancer underwent surgical therapy at age 61.   Imaging studies: [facility] pet imaging (08/08/2022) -no evidence of metastatic disease. He did incidentally have small subcentimeter bilateral pulmonary nodules. These did not demonstrate uptake.   PMH: He has a history of arthritis.  PSH: He has undergone a lumbar fusion that was performed via an anterior approach by Dr. Jene Every. This was performed via an anterior but retroperitoneal approach.   TNM stage: cT2 N0 M0  PSA: 28.9  Gleason  score: 4+4 = 8 (grade group 4)  Biopsy (07/06/2022): 6/12 cores positive  Left: Left lateral apex (50%, 4+4 = 8), left apex (50%, 4+4 = 8), left lateral mid (2%, 3+3 = 6), left mid (28%, 4+3 = 7), left lateral base (90%, 4+3 = 7), left base (37%, 3+4 = 7)  Right: Benign  Prostate volume: 24.9 cc   Nomogram  OC disease: 14%  EPE: 83%  SVI: 30%  LNI: 35%  PFS (5 year, 10 year): 23%, 13%   Urinary function: IPSS is 5.  Erectile function: SHIM score is 11.     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 Smoked for 40 years. Smoked 1 pack per day.   Tobacco Use Assessment Completed: Used Tobacco in last 30 days? Does not use smokeless tobacco. Has never drank.  Does not use drugs. Drinks 4+ caffeinated drinks per day. Has not had a blood transfusion.    REVIEW OF SYSTEMS:    GU Review Male:   Patient denies frequent urination, hard to postpone urination, burning/ pain with urination, get up at night to urinate, leakage of urine, stream starts and stops, trouble starting your stream, have to strain to  urinate , erection problems, and penile pain.  Gastrointestinal (Upper):   Patient denies nausea, vomiting, and indigestion/ heartburn.  Gastrointestinal (Lower):   Patient denies diarrhea and constipation.  Constitutional:   Patient denies fever, night sweats, weight loss, and fatigue.  Skin:   Patient denies skin rash/ lesion and itching.  Eyes:   Patient denies blurred vision and double vision.  Ears/ Nose/ Throat:   Patient denies sore throat and sinus problems.   Hematologic/Lymphatic:   Patient denies swollen glands and easy bruising.  Cardiovascular:   Patient denies leg swelling and chest pains.  Respiratory:   Patient reports cough. Patient denies shortness of breath.  Endocrine:   Patient denies excessive thirst.  Musculoskeletal:   Patient denies back pain and joint pain.  Neurological:   Patient denies headaches and dizziness.  Psychologic:   Patient denies depression and anxiety.   Notes: cough due to recent URI     VITAL SIGNS:      11/09/2022 03:13 PM  Weight 149 lb / 67.59 kg  Height 73 in / 185.42 cm  BP 112/66 mmHg  Pulse 76 /min  BMI 19.7 kg/m   MULTI-SYSTEM PHYSICAL EXAMINATION:    Constitutional: Well-nourished. No physical deformities. Normally developed. Good grooming.  Neck: Neck symmetrical, not swollen. Normal tracheal position.  Respiratory: Normal breath sounds. No labored breathing, no use of accessory muscles.   Cardiovascular: Regular rate and rhythm. No murmur, no gallop.   Lymphatic: No enlargement of neck, axillae, groin.  Skin: No paleness, no jaundice, no cyanosis. No lesion, no ulcer, no rash.  Neurologic / Psychiatric: Oriented to time, oriented to place, oriented to person. No depression, no anxiety, no agitation.  Gastrointestinal: No mass, no tenderness, no rigidity, non obese abdomen.  Eyes: Normal conjunctivae. Normal eyelids.  Ears, Nose, Mouth, and Throat: Left ear no scars, no lesions, no masses. Right ear no scars, no lesions, no masses. Nose no scars, no lesions, no masses. Normal hearing. Normal lips.  Musculoskeletal: Normal gait and station of head and neck.     Complexity of Data:  Records Review:   Previous Patient Records  Urine Test Review:   Urinalysis   11/09/22  Urinalysis  Urine Appearance Clear   Urine Color Yellow   Urine Glucose Neg mg/dL  Urine Bilirubin Neg mg/dL  Urine Ketones Neg mg/dL  Urine Specific Gravity 1.015   Urine Blood Neg ery/uL  Urine pH 7.5   Urine  Protein Trace mg/dL  Urine Urobilinogen 0.2 mg/dL  Urine Nitrites Neg   Urine Leukocyte Esterase Neg leu/uL   PROCEDURES:          Urinalysis - 81003 Dipstick Dipstick Cont'd  Color: Yellow Bilirubin: Neg mg/dL  Appearance: Clear Ketones: Neg mg/dL  Specific Gravity: 6.433 Blood: Neg ery/uL  pH: 7.5 Protein: Trace mg/dL  Glucose: Neg mg/dL Urobilinogen: 0.2 mg/dL    Nitrites: Neg    Leukocyte Esterase: Neg leu/uL    ASSESSMENT:      ICD-10 Details  1 GU:   Prostate Cancer - C61    PLAN:           Schedule Return Visit/Planned Activity: Keep Scheduled Appointment - Schedule Surgery          Document Letter(s):  Created for Patient: Clinical Summary         Notes:   There are no changes in the patients history or physical exam since last evaluation by Dr. Laverle Patter. Pt is scheduled to undergo RALP with BPLND on 11/15/22.  All pt's questions were answered to the best of my ability.          Next Appointment:      Next Appointment: 11/15/2022 11:15 AM    Appointment Type: Surgery     Location: [facility] [phone]    Provider: Heloise Purpura, M.D.    Reason for Visit: WL/OBS RA LAP RAD PROSTATECTOMY LEV 2 AND BPLND [facility] $378.77 11/4      * Signed by Ulyses Amor, PA on 11/09/22 at 3:32 PM (EST*

## 2022-11-15 ENCOUNTER — Other Ambulatory Visit: Payer: Self-pay

## 2022-11-15 ENCOUNTER — Encounter (HOSPITAL_COMMUNITY): Payer: Self-pay | Admitting: Urology

## 2022-11-15 ENCOUNTER — Observation Stay (HOSPITAL_COMMUNITY)
Admission: RE | Admit: 2022-11-15 | Discharge: 2022-11-16 | Disposition: A | Discharge status: Home / Self Care | Payer: 59 | Attending: Urology | Admitting: Urology

## 2022-11-15 ENCOUNTER — Encounter (HOSPITAL_COMMUNITY)
Admission: RE | Disposition: A | Discharge status: Home / Self Care | Payer: Self-pay | Source: Home / Self Care | Attending: Urology

## 2022-11-15 ENCOUNTER — Ambulatory Visit (HOSPITAL_COMMUNITY): Payer: 59 | Admitting: Medical

## 2022-11-15 ENCOUNTER — Ambulatory Visit (HOSPITAL_BASED_OUTPATIENT_CLINIC_OR_DEPARTMENT_OTHER): Payer: 59 | Admitting: Anesthesiology

## 2022-11-15 DIAGNOSIS — C61 Malignant neoplasm of prostate: Secondary | ICD-10-CM | POA: Diagnosis present

## 2022-11-15 HISTORY — PX: ROBOT ASSISTED LAPAROSCOPIC RADICAL PROSTATECTOMY: SHX5141

## 2022-11-15 HISTORY — PX: LYMPHADENECTOMY: SHX5960

## 2022-11-15 LAB — TYPE AND SCREEN
ABO/RH(D): O POS
Antibody Screen: NEGATIVE

## 2022-11-15 LAB — HEMOGLOBIN AND HEMATOCRIT, BLOOD
HCT: 41.8 % (ref 39.0–52.0)
Hemoglobin: 14.1 g/dL (ref 13.0–17.0)

## 2022-11-15 SURGERY — XI ROBOTIC ASSISTED LAPAROSCOPIC RADICAL PROSTATECTOMY LEVEL 2
Anesthesia: General

## 2022-11-15 MED ORDER — ROCURONIUM BROMIDE 10 MG/ML (PF) SYRINGE
PREFILLED_SYRINGE | INTRAVENOUS | Status: AC
  Filled 2022-11-15: qty 10

## 2022-11-15 MED ORDER — DOCUSATE SODIUM 100 MG PO CAPS
100.0000 mg | ORAL_CAPSULE | Freq: Two times a day (BID) | ORAL | Status: DC
  Administered 2022-11-15 – 2022-11-16 (×2): 100 mg via ORAL
  Filled 2022-11-15 (×2): qty 1

## 2022-11-15 MED ORDER — ZOLPIDEM TARTRATE 5 MG PO TABS: 5.0000 mg | ORAL_TABLET | Freq: Every evening | ORAL | Status: DC | PRN

## 2022-11-15 MED ORDER — EPHEDRINE SULFATE-NACL 50-0.9 MG/10ML-% IV SOSY
PREFILLED_SYRINGE | INTRAVENOUS | Status: DC | PRN
  Administered 2022-11-15 (×3): 5 mg via INTRAVENOUS

## 2022-11-15 MED ORDER — ONDANSETRON HCL 4 MG/2ML IJ SOLN: 4.0000 mg | Freq: Once | INTRAMUSCULAR | Status: DC | PRN

## 2022-11-15 MED ORDER — MORPHINE SULFATE (PF) 2 MG/ML IV SOLN
2.0000 mg | INTRAVENOUS | Status: DC | PRN
  Administered 2022-11-15: 4 mg via INTRAVENOUS
  Filled 2022-11-15: qty 2

## 2022-11-15 MED ORDER — BUPIVACAINE-EPINEPHRINE 0.25% -1:200000 IJ SOLN
INTRAMUSCULAR | Status: AC
  Filled 2022-11-15: qty 1

## 2022-11-15 MED ORDER — FENTANYL CITRATE PF 50 MCG/ML IJ SOSY
25.0000 ug | PREFILLED_SYRINGE | INTRAMUSCULAR | Status: DC | PRN
  Administered 2022-11-15 (×2): 50 ug via INTRAVENOUS

## 2022-11-15 MED ORDER — BUPIVACAINE-EPINEPHRINE 0.25% -1:200000 IJ SOLN
INTRAMUSCULAR | Status: DC | PRN
  Administered 2022-11-15: 30 mL

## 2022-11-15 MED ORDER — TRIPLE ANTIBIOTIC 3.5-400-5000 EX OINT: 1.0000 | TOPICAL_OINTMENT | Freq: Three times a day (TID) | CUTANEOUS | Status: DC | PRN

## 2022-11-15 MED ORDER — OXYCODONE HCL 5 MG/5ML PO SOLN: 5.0000 mg | Freq: Once | ORAL | Status: DC | PRN

## 2022-11-15 MED ORDER — TRAMADOL HCL 50 MG PO TABS: 50.0000 mg | ORAL_TABLET | Freq: Four times a day (QID) | ORAL | 0 refills | Status: AC | PRN

## 2022-11-15 MED ORDER — MIDAZOLAM HCL 2 MG/2ML IJ SOLN
INTRAMUSCULAR | Status: AC
  Filled 2022-11-15: qty 2

## 2022-11-15 MED ORDER — GLYCOPYRROLATE 0.2 MG/ML IJ SOLN
INTRAMUSCULAR | Status: AC
  Filled 2022-11-15: qty 1

## 2022-11-15 MED ORDER — FLEET ENEMA RE ENEM
1.0000 | ENEMA | Freq: Once | RECTAL | Status: AC
  Administered 2022-11-14: 1 via RECTAL

## 2022-11-15 MED ORDER — ONDANSETRON HCL 4 MG/2ML IJ SOLN
INTRAMUSCULAR | Status: DC | PRN
  Administered 2022-11-15: 4 mg via INTRAVENOUS

## 2022-11-15 MED ORDER — PHENYLEPHRINE HCL-NACL 20-0.9 MG/250ML-% IV SOLN
INTRAVENOUS | Status: DC | PRN
  Administered 2022-11-15: 35 ug/min via INTRAVENOUS

## 2022-11-15 MED ORDER — CEFAZOLIN SODIUM-DEXTROSE 2-4 GM/100ML-% IV SOLN
2.0000 g | INTRAVENOUS | Status: AC
  Administered 2022-11-15: 2 g via INTRAVENOUS
  Filled 2022-11-15: qty 100

## 2022-11-15 MED ORDER — CEFAZOLIN SODIUM-DEXTROSE 1-4 GM/50ML-% IV SOLN
1.0000 g | Freq: Three times a day (TID) | INTRAVENOUS | Status: AC
  Administered 2022-11-15 – 2022-11-16 (×2): 1 g via INTRAVENOUS
  Filled 2022-11-15 (×2): qty 50

## 2022-11-15 MED ORDER — LIDOCAINE HCL (PF) 2 % IJ SOLN
INTRAMUSCULAR | Status: AC
  Filled 2022-11-15: qty 5

## 2022-11-15 MED ORDER — ONDANSETRON HCL 4 MG/2ML IJ SOLN: 4.0000 mg | INTRAMUSCULAR | Status: DC | PRN

## 2022-11-15 MED ORDER — ALBUTEROL SULFATE (2.5 MG/3ML) 0.083% IN NEBU
3.0000 mL | INHALATION_SOLUTION | RESPIRATORY_TRACT | Status: DC | PRN
Start: 2022-11-15 — End: 2022-11-16

## 2022-11-15 MED ORDER — FENTANYL CITRATE (PF) 100 MCG/2ML IJ SOLN
INTRAMUSCULAR | Status: DC | PRN
  Administered 2022-11-15 (×5): 50 ug via INTRAVENOUS

## 2022-11-15 MED ORDER — PROPOFOL 10 MG/ML IV BOLUS
INTRAVENOUS | Status: AC
  Filled 2022-11-15: qty 20

## 2022-11-15 MED ORDER — DEXAMETHASONE SODIUM PHOSPHATE 10 MG/ML IJ SOLN
INTRAMUSCULAR | Status: DC | PRN
  Administered 2022-11-15: 10 mg via INTRAVENOUS

## 2022-11-15 MED ORDER — STERILE WATER FOR IRRIGATION IR SOLN
Status: DC | PRN
  Administered 2022-11-15: 1000 mL

## 2022-11-15 MED ORDER — DEXAMETHASONE SODIUM PHOSPHATE 10 MG/ML IJ SOLN
INTRAMUSCULAR | Status: AC
  Filled 2022-11-15: qty 1

## 2022-11-15 MED ORDER — FENTANYL CITRATE (PF) 250 MCG/5ML IJ SOLN
INTRAMUSCULAR | Status: AC
  Filled 2022-11-15: qty 5

## 2022-11-15 MED ORDER — OXYCODONE HCL 5 MG PO TABS: 5.0000 mg | ORAL_TABLET | Freq: Once | ORAL | Status: DC | PRN

## 2022-11-15 MED ORDER — ACETAMINOPHEN 325 MG PO TABS: 650.0000 mg | ORAL_TABLET | ORAL | Status: DC | PRN

## 2022-11-15 MED ORDER — GLYCOPYRROLATE 0.2 MG/ML IJ SOLN
INTRAMUSCULAR | Status: DC | PRN
  Administered 2022-11-15: .2 mg via INTRAVENOUS

## 2022-11-15 MED ORDER — PROPOFOL 10 MG/ML IV BOLUS
INTRAVENOUS | Status: DC | PRN
  Administered 2022-11-15: 150 mg via INTRAVENOUS

## 2022-11-15 MED ORDER — SODIUM CHLORIDE 0.9 % IV SOLN: INTRAVENOUS | Status: DC | PRN

## 2022-11-15 MED ORDER — KCL IN DEXTROSE-NACL 20-5-0.45 MEQ/L-%-% IV SOLN
INTRAVENOUS | Status: DC
  Filled 2022-11-15: qty 1000

## 2022-11-15 MED ORDER — MEPERIDINE HCL 50 MG/ML IJ SOLN: 6.2500 mg | INTRAMUSCULAR | Status: DC | PRN

## 2022-11-15 MED ORDER — CHLORHEXIDINE GLUCONATE 0.12 % MT SOLN
15.0000 mL | Freq: Once | OROMUCOSAL | Status: AC
  Administered 2022-11-15: 15 mL via OROMUCOSAL

## 2022-11-15 MED ORDER — ORAL CARE MOUTH RINSE: 15.0000 mL | Freq: Once | OROMUCOSAL | Status: AC

## 2022-11-15 MED ORDER — HYOSCYAMINE SULFATE 0.125 MG SL SUBL: 0.1250 mg | SUBLINGUAL_TABLET | Freq: Four times a day (QID) | SUBLINGUAL | Status: DC | PRN

## 2022-11-15 MED ORDER — FENTANYL CITRATE PF 50 MCG/ML IJ SOSY
PREFILLED_SYRINGE | INTRAMUSCULAR | Status: AC
  Filled 2022-11-15: qty 2

## 2022-11-15 MED ORDER — LIDOCAINE 2% (20 MG/ML) 5 ML SYRINGE
INTRAMUSCULAR | Status: DC | PRN
  Administered 2022-11-15: 100 mg via INTRAVENOUS

## 2022-11-15 MED ORDER — SUGAMMADEX SODIUM 200 MG/2ML IV SOLN
INTRAVENOUS | Status: DC | PRN
  Administered 2022-11-15: 200 mg via INTRAVENOUS

## 2022-11-15 MED ORDER — ORAL CARE MOUTH RINSE: 15.0000 mL | OROMUCOSAL | Status: DC | PRN

## 2022-11-15 MED ORDER — SODIUM CHLORIDE 0.9 % IV BOLUS
1000.0000 mL | Freq: Once | INTRAVENOUS | Status: AC
  Administered 2022-11-15: 1000 mL via INTRAVENOUS

## 2022-11-15 MED ORDER — DOCUSATE SODIUM 100 MG PO CAPS: 100.0000 mg | ORAL_CAPSULE | Freq: Two times a day (BID) | ORAL | Status: AC

## 2022-11-15 MED ORDER — MIDAZOLAM HCL 5 MG/5ML IJ SOLN
INTRAMUSCULAR | Status: DC | PRN
  Administered 2022-11-15: 2 mg via INTRAVENOUS

## 2022-11-15 MED ORDER — MAGNESIUM CITRATE PO SOLN
1.0000 | Freq: Once | ORAL | Status: AC
  Administered 2022-11-14: 1 via ORAL

## 2022-11-15 MED ORDER — ACETAMINOPHEN 160 MG/5ML PO SOLN: 325.0000 mg | ORAL | Status: DC | PRN

## 2022-11-15 MED ORDER — CHLORHEXIDINE GLUCONATE CLOTH 2 % EX PADS
6.0000 | MEDICATED_PAD | Freq: Every day | CUTANEOUS | Status: DC
  Administered 2022-11-15 – 2022-11-16 (×2): 6 via TOPICAL

## 2022-11-15 MED ORDER — KETOROLAC TROMETHAMINE 15 MG/ML IJ SOLN
15.0000 mg | Freq: Four times a day (QID) | INTRAMUSCULAR | Status: DC
  Administered 2022-11-15 – 2022-11-16 (×4): 15 mg via INTRAVENOUS
  Filled 2022-11-15 (×4): qty 1

## 2022-11-15 MED ORDER — ACETAMINOPHEN 325 MG PO TABS: 325.0000 mg | ORAL_TABLET | ORAL | Status: DC | PRN

## 2022-11-15 MED ORDER — ROCURONIUM BROMIDE 10 MG/ML (PF) SYRINGE
PREFILLED_SYRINGE | INTRAVENOUS | Status: DC | PRN
  Administered 2022-11-15: 10 mg via INTRAVENOUS
  Administered 2022-11-15: 100 mg via INTRAVENOUS

## 2022-11-15 MED ORDER — PROPOFOL 500 MG/50ML IV EMUL
INTRAVENOUS | Status: DC | PRN
  Administered 2022-11-15: 50 ug/kg/min via INTRAVENOUS

## 2022-11-15 MED ORDER — ONDANSETRON HCL 4 MG/2ML IJ SOLN
INTRAMUSCULAR | Status: AC
  Filled 2022-11-15: qty 2

## 2022-11-15 MED ORDER — EPHEDRINE 5 MG/ML INJ
INTRAVENOUS | Status: AC
  Filled 2022-11-15: qty 5

## 2022-11-15 MED ORDER — PROPOFOL 1000 MG/100ML IV EMUL
INTRAVENOUS | Status: AC
  Filled 2022-11-15: qty 100

## 2022-11-15 MED ORDER — DIPHENHYDRAMINE HCL 50 MG/ML IJ SOLN: 12.5000 mg | Freq: Four times a day (QID) | INTRAMUSCULAR | Status: DC | PRN

## 2022-11-15 MED ORDER — LACTATED RINGERS IV SOLN
INTRAVENOUS | Status: DC
Start: 2022-11-15 — End: 2022-11-15

## 2022-11-15 MED ORDER — DIPHENHYDRAMINE HCL 12.5 MG/5ML PO ELIX: 12.5000 mg | ORAL_SOLUTION | Freq: Four times a day (QID) | ORAL | Status: DC | PRN

## 2022-11-15 MED ORDER — SULFAMETHOXAZOLE-TRIMETHOPRIM 800-160 MG PO TABS: 1.0000 | ORAL_TABLET | Freq: Two times a day (BID) | ORAL | 0 refills | Status: AC

## 2022-11-15 SURGICAL SUPPLY — 67 items
ADH SKN CLS APL DERMABOND .7 (GAUZE/BANDAGES/DRESSINGS) ×2
APL PRP STRL LF DISP 70% ISPRP (MISCELLANEOUS) ×2
APL SWBSTK 6 STRL LF DISP (MISCELLANEOUS) ×2
APPLICATOR COTTON TIP 6 STRL (MISCELLANEOUS) ×2 IMPLANT
APPLICATOR COTTON TIP 6IN STRL (MISCELLANEOUS) ×2
BAG COUNTER SPONGE SURGICOUNT (BAG) IMPLANT
BAG SPNG CNTER NS LX DISP (BAG)
CATH FOLEY 2WAY SLVR 18FR 30CC (CATHETERS) ×2 IMPLANT
CATH ROBINSON RED A/P 16FR (CATHETERS) ×2 IMPLANT
CATH ROBINSON RED A/P 8FR (CATHETERS) ×2 IMPLANT
CATH TIEMANN FOLEY 18FR 5CC (CATHETERS) ×2 IMPLANT
CHLORAPREP W/TINT 26 (MISCELLANEOUS) ×2 IMPLANT
CLIP LIGATING HEM O LOK PURPLE (MISCELLANEOUS) ×2 IMPLANT
COVER SURGICAL LIGHT HANDLE (MISCELLANEOUS) ×2 IMPLANT
COVER TIP SHEARS 8 DVNC (MISCELLANEOUS) ×2 IMPLANT
CUTTER ECHEON FLEX ENDO 45 340 (ENDOMECHANICALS) ×2 IMPLANT
DERMABOND ADVANCED .7 DNX12 (GAUZE/BANDAGES/DRESSINGS) ×2 IMPLANT
DRAIN CHANNEL RND F F (WOUND CARE) IMPLANT
DRAPE ARM DVNC X/XI (DISPOSABLE) ×8 IMPLANT
DRAPE COLUMN DVNC XI (DISPOSABLE) ×2 IMPLANT
DRAPE SURG IRRIG POUCH 19X23 (DRAPES) ×2 IMPLANT
DRIVER NDL LRG 8 DVNC XI (INSTRUMENTS) ×4 IMPLANT
DRIVER NDLE LRG 8 DVNC XI (INSTRUMENTS) ×4
DRSG TEGADERM 4X4.75 (GAUZE/BANDAGES/DRESSINGS) ×2 IMPLANT
ELECT PENCIL ROCKER SW 15FT (MISCELLANEOUS) ×2 IMPLANT
ELECT REM PT RETURN 15FT ADLT (MISCELLANEOUS) ×2 IMPLANT
FORCEPS BPLR LNG DVNC XI (INSTRUMENTS) ×2 IMPLANT
FORCEPS PROGRASP DVNC XI (FORCEP) ×2 IMPLANT
GAUZE SPONGE 4X4 12PLY STRL (GAUZE/BANDAGES/DRESSINGS) ×2 IMPLANT
GLOVE BIO SURGEON STRL SZ 6.5 (GLOVE) ×2 IMPLANT
GLOVE SURG LX STRL 7.5 STRW (GLOVE) ×4 IMPLANT
GOWN STRL REUS W/ TWL XL LVL3 (GOWN DISPOSABLE) ×4 IMPLANT
GOWN STRL REUS W/TWL XL LVL3 (GOWN DISPOSABLE) ×8
GOWN STRL SURGICAL XL XLNG (GOWN DISPOSABLE) ×2 IMPLANT
HOLDER FOLEY CATH W/STRAP (MISCELLANEOUS) ×2 IMPLANT
IRRIG SUCT STRYKERFLOW 2 WTIP (MISCELLANEOUS) ×2
IRRIGATION SUCT STRKRFLW 2 WTP (MISCELLANEOUS) ×2 IMPLANT
IV LACTATED RINGERS 1000ML (IV SOLUTION) ×2 IMPLANT
KIT TURNOVER KIT A (KITS) IMPLANT
NDL SAFETY ECLIPSE 18X1.5 (NEEDLE) IMPLANT
PACK ROBOT UROLOGY CUSTOM (CUSTOM PROCEDURE TRAY) ×2 IMPLANT
PLUG CATH AND CAP STRL 200 (CATHETERS) ×2 IMPLANT
RELOAD STAPLE 45 4.1 GRN THCK (STAPLE) ×2 IMPLANT
SCISSORS MNPLR CVD DVNC XI (INSTRUMENTS) ×2 IMPLANT
SEAL UNIV 5-12 XI (MISCELLANEOUS) ×8 IMPLANT
SET CYSTO W/LG BORE CLAMP LF (SET/KITS/TRAYS/PACK) IMPLANT
SET TUBE SMOKE EVAC HIGH FLOW (TUBING) ×2 IMPLANT
SOL ELECTROSURG ANTI STICK (MISCELLANEOUS) ×2
SOL PREP POV-IOD 4OZ 10% (MISCELLANEOUS) ×2 IMPLANT
SOLUTION ELECTROSURG ANTI STCK (MISCELLANEOUS) ×2 IMPLANT
SPIKE FLUID TRANSFER (MISCELLANEOUS) ×2 IMPLANT
STAPLE RELOAD 45 GRN (STAPLE) ×2
SUT ETHILON 3 0 PS 1 (SUTURE) ×2 IMPLANT
SUT MNCRL 3 0 RB1 (SUTURE) ×2 IMPLANT
SUT MNCRL 3 0 VIOLET RB1 (SUTURE) ×2 IMPLANT
SUT MNCRL AB 4-0 PS2 18 (SUTURE) ×4 IMPLANT
SUT PDS PLUS AB 0 CT-2 (SUTURE) ×4 IMPLANT
SUT VIC AB 0 CT1 27 (SUTURE) ×4
SUT VIC AB 0 CT1 27XBRD ANTBC (SUTURE) ×4 IMPLANT
SUT VIC AB 2-0 SH 27 (SUTURE) ×4
SUT VIC AB 2-0 SH 27X BRD (SUTURE) ×2 IMPLANT
SUT VIC AB 3-0 SH 27 (SUTURE)
SUT VIC AB 3-0 SH 27X BRD (SUTURE) IMPLANT
SYR 27GX1/2 1ML LL SAFETY (SYRINGE) ×2 IMPLANT
TOWEL OR NON WOVEN STRL DISP B (DISPOSABLE) ×2 IMPLANT
TROCAR Z THREAD OPTICAL 12X100 (TROCAR) IMPLANT
WATER STERILE IRR 1000ML POUR (IV SOLUTION) ×2 IMPLANT

## 2022-11-15 NOTE — Op Note (Signed)
Preoperative diagnosis: Clinically localized adenocarcinoma of the prostate (clinical stage T1c N0 M0)  Postoperative diagnosis: Clinically localized adenocarcinoma of the prostate (clinical stage T1c N0 M0)  Procedure:  Robotic assisted laparoscopic radical prostatectomy (right nerve sparing) Bilateral robotic assisted laparoscopic pelvic lymphadenectomy  Surgeon: Moody Bruins. M.D.  Assistant(s): Harrie Foreman, PA-C  An assistant was required for this surgical procedure.  The duties of the assistant included but were not limited to suctioning, passing suture, camera manipulation, retraction. This procedure would not be able to be performed without an Geophysicist/field seismologist.   Resident: Dr. Michaelene Song  Anesthesia: General  Complications: None  EBL: 50 mL  IVF:  1000 mL crystalloid  Specimens: Prostate and seminal vesicles Right pelvic lymph nodes Left pelvic lymph nodes  Disposition of specimens: Pathology  Drains: 20 Fr coude catheter # 19 Blake pelvic drain  Indication: Christopher Wells is a 57 y.o. patient with clinically localized prostate cancer.  After a thorough review of the management options for treatment of prostate cancer, he elected to proceed with surgical therapy and the above procedure(s).  We have discussed the potential benefits and risks of the procedure, side effects of the proposed treatment, the likelihood of the patient achieving the goals of the procedure, and any potential problems that might occur during the procedure or recuperation. Informed consent has been obtained.  Description of procedure:  The patient was taken to the operating room and a general anesthetic was administered. He was given preoperative antibiotics, placed in the dorsal lithotomy position, and prepped and draped in the usual sterile fashion. Next a preoperative timeout was performed. A urethral catheter was placed into the bladder and a site was selected near the umbilicus for  placement of the camera port. This was placed using a standard open Hassan technique which allowed entry into the peritoneal cavity under direct vision and without difficulty. An 8 mm port was placed and a pneumoperitoneum established. The camera was then used to inspect the abdomen and there was no evidence of any intra-abdominal injuries or other abnormalities. The remaining abdominal ports were then placed. 8 mm robotic ports were placed in the right lower quadrant, left lower quadrant, and far left lateral abdominal wall. A 5 mm port was placed in the right upper quadrant and a 12 mm port was placed in the right lateral abdominal wall for laparoscopic assistance. All ports were placed under direct vision without difficulty. The surgical cart was then docked.   Utilizing the cautery scissors, the bladder was reflected posteriorly allowing entry into the space of Retzius and identification of the endopelvic fascia and prostate. The periprostatic fat was then removed from the prostate allowing full exposure of the endopelvic fascia. The endopelvic fascia was then incised from the apex back to the base of the prostate bilaterally and the underlying levator muscle fibers were swept laterally off the prostate thereby isolating the dorsal venous complex. The dorsal vein was then stapled and divided with a 45 mm Flex Echelon stapler. Attention then turned to the bladder neck which was divided anteriorly thereby allowing entry into the bladder and exposure of the urethral catheter. The catheter balloon was deflated and the catheter was brought into the operative field and used to retract the prostate anteriorly. The posterior bladder neck was then examined and was divided allowing further dissection between the bladder and prostate posteriorly until the vasa deferentia and seminal vessels were identified. The vasa deferentia were isolated, divided, and lifted anteriorly. The seminal vesicles were  dissected down to  their tips with care to control the seminal vascular arterial blood supply. These structures were then lifted anteriorly and the space between Denonvillier's fascia and the anterior rectum was developed with a combination of sharp and blunt dissection. This isolated the vascular pedicles of the prostate.  The lateral prostatic fascia on the right side of the prostate was then sharply incised allowing release of the neurovascular bundle. The vascular pedicle of the prostate on the right side was then ligated with Weck clips between the prostate and neurovascular bundle and divided with sharp cold scissor dissection resulting in neurovascular bundle preservation. On the left side, a wide non nerve sparing dissection was performed with Weck clips used to ligate the vascular pedicle of the prostate. The neurovascular bundle on the right side was then separated off the apex of the prostate and urethra.  The urethra was then sharply transected allowing the prostate specimen to be disarticulated. The pelvis was copiously irrigated and hemostasis was ensured. There was no evidence for rectal injury.  Attention then turned to the right pelvic sidewall. The fibrofatty tissue between the external iliac vein, confluence of the iliac vessels, hypogastric artery, and Cooper's ligament was dissected free from the pelvic sidewall with care to preserve the obturator nerve. Weck clips were used for lymphostasis and hemostasis. An identical procedure was performed on the contralateral side and the lymphatic packets were removed for permanent pathologic analysis.  Attention then turned to the urethral anastomosis. A 2-0 Vicryl slip knot was placed between Denonvillier's fascia, the posterior bladder neck, and the posterior urethra to reapproximate these structures. A double-armed 3-0 Monocryl suture was then used to perform a 360 running tension-free anastomosis between the bladder neck and urethra. A new urethral catheter was  then placed into the bladder and irrigated. There were no blood clots within the bladder and the anastomosis appeared to be watertight. A #19 Blake drain was then brought through the left lateral 8 mm port site and positioned appropriately within the pelvis. It was secured to the skin with a nylon suture. The surgical cart was then undocked. The right lateral 12 mm port site was closed at the fascial level with a 0 Vicryl suture placed laparoscopically. All remaining ports were then removed under direct vision. The prostate specimen was removed intact within the Endopouch retrieval bag via the periumbilical camera port site. This fascial opening was closed with two running 0 Vicryl sutures. 0.25% Marcaine was then injected into all port sites and all incisions were reapproximated at the skin level with 4-0 Monocryl subcuticular sutures. Dermabond was applied. The patient appeared to tolerate the procedure well and without complications. The patient was able to be extubated and transferred to the recovery unit in satisfactory condition.   Moody Bruins MD

## 2022-11-15 NOTE — Anesthesia Preprocedure Evaluation (Addendum)
Anesthesia Evaluation  Patient identified by MRN, date of birth, ID band Patient awake    Reviewed: Allergy & Precautions, H&P , NPO status , Patient's Chart, lab work & pertinent test results  History of Anesthesia Complications (+) history of anesthetic complications  Airway Mallampati: II  TM Distance: >3 FB Neck ROM: Full    Dental no notable dental hx. (+) Teeth Intact, Dental Advisory Given   Pulmonary COPD, former smoker   Pulmonary exam normal breath sounds clear to auscultation       Cardiovascular negative cardio ROS  Rhythm:Regular Rate:Normal     Neuro/Psych negative neurological ROS  negative psych ROS   GI/Hepatic negative GI ROS, Neg liver ROS,,,  Endo/Other  negative endocrine ROS    Renal/GU negative Renal ROS  negative genitourinary   Musculoskeletal  (+) Arthritis ,    Abdominal   Peds  Hematology negative hematology ROS (+)   Anesthesia Other Findings   Reproductive/Obstetrics negative OB ROS                             Anesthesia Physical Anesthesia Plan  ASA: 3  Anesthesia Plan: General   Post-op Pain Management: Minimal or no pain anticipated, Ofirmev IV (intra-op)*, Dilaudid IV and Ketamine IV*   Induction: Intravenous  PONV Risk Score and Plan: 2 and Ondansetron, Dexamethasone, Treatment may vary due to age or medical condition and Propofol infusion  Airway Management Planned: Oral ETT  Additional Equipment: None  Intra-op Plan:   Post-operative Plan: Extubation in OR  Informed Consent: I have reviewed the patients History and Physical, chart, labs and discussed the procedure including the risks, benefits and alternatives for the proposed anesthesia with the patient or authorized representative who has indicated his/her understanding and acceptance.     Dental advisory given  Plan Discussed with: CRNA and Anesthesiologist  Anesthesia Plan  Comments:         Anesthesia Quick Evaluation

## 2022-11-15 NOTE — Anesthesia Procedure Notes (Signed)
Procedure Name: Intubation Date/Time: 11/15/2022 11:46 AM  Performed by: Orest Dikes, CRNAPre-anesthesia Checklist: Patient identified, Emergency Drugs available, Suction available and Patient being monitored Patient Re-evaluated:Patient Re-evaluated prior to induction Oxygen Delivery Method: Circle system utilized Preoxygenation: Pre-oxygenation with 100% oxygen Induction Type: IV induction Ventilation: Mask ventilation without difficulty Laryngoscope Size: Mac and 4 Grade View: Grade II Tube type: Oral Tube size: 7.5 mm Number of attempts: 1 Airway Equipment and Method: Stylet Placement Confirmation: ETT inserted through vocal cords under direct vision, positive ETCO2 and breath sounds checked- equal and bilateral Secured at: 21 cm Tube secured with: Tape Dental Injury: Teeth and Oropharynx as per pre-operative assessment

## 2022-11-15 NOTE — Discharge Instructions (Signed)

## 2022-11-15 NOTE — Progress Notes (Signed)
Patient ID: Christopher Wells, male   DOB: June 23, 1965, 57 y.o.   MRN: [id number]  Post-op note  Subjective: The patient is doing well.  No complaints.  Objective: Vital signs in last 24 hours: Temp:  [97.5 F (36.4 C)-98.5 F (36.9 C)] 97.5 F (36.4 C) (11/11 1606) Pulse Rate:  [68-87] 69 (11/11 1606) Resp:  [13-22] 22 (11/11 1606) BP: (100-131)/(50-76) 102/62 (11/11 1606) SpO2:  [94 %-100 %] 100 % (11/11 1606)  Intake/Output from previous day: No intake/output data recorded. Intake/Output this shift: Total I/O In: 1400 [I.V.:1300; IV Piggyback:100] Out: 50 [Blood:50]  Physical Exam:  General: Alert and oriented. Abdomen: Soft, Nondistended. Incisions: Clean and dry. GU: Urine draining well.  Lab Results: Recent Labs    11/15/22 1452  HGB 14.1  HCT 41.8    Assessment/Plan: POD#0   1) Continue to monitor, ambulate, IS   Moody Bruins. MD   LOS: 0 days   Crecencio Mc 11/15/2022, 4:48 PM

## 2022-11-15 NOTE — Transfer of Care (Signed)
Immediate Anesthesia Transfer of Care Note  Patient: Sharyl Nimrod  Procedure(s) Performed: XI ROBOTIC ASSISTED LAPAROSCOPIC RADICAL PROSTATECTOMY LEVEL 2 BILATERAL PELVIC LYMPHADENECTOMY (Bilateral)  Patient Location: PACU  Anesthesia Type:General  Level of Consciousness: awake, alert , and oriented  Airway & Oxygen Therapy: Patient Spontanous Breathing and Patient connected to face mask oxygen  Post-op Assessment: Report given to RN and Post -op Vital signs reviewed and stable  Post vital signs: Reviewed and stable  Last Vitals:  Vitals Value Taken Time  BP 108/64 11/15/22 1439  Temp    Pulse 88 11/15/22 1440  Resp 15 11/15/22 1440  SpO2 100 % 11/15/22 1440  Vitals shown include unfiled device data.  Last Pain:  Vitals:   11/15/22 0916  TempSrc: Oral  PainSc:       Patients Stated Pain Goal: 4 (11/15/22 0909)  Complications: No notable events documented.

## 2022-11-15 NOTE — Anesthesia Postprocedure Evaluation (Signed)
Anesthesia Post Note  Patient: Christopher Wells  Procedure(s) Performed: XI ROBOTIC ASSISTED LAPAROSCOPIC RADICAL PROSTATECTOMY LEVEL 2 BILATERAL PELVIC LYMPHADENECTOMY (Bilateral)     Patient location during evaluation: PACU Anesthesia Type: General Level of consciousness: awake and alert Pain management: pain level controlled Vital Signs Assessment: post-procedure vital signs reviewed and stable Respiratory status: spontaneous breathing, nonlabored ventilation, respiratory function stable and patient connected to nasal cannula oxygen Cardiovascular status: blood pressure returned to baseline and stable Postop Assessment: no apparent nausea or vomiting Anesthetic complications: no   No notable events documented.  Last Vitals:  Vitals:   11/15/22 1500 11/15/22 1515  BP: 107/62 (!) 102/52  Pulse: 84 86  Resp: 16 16  Temp:    SpO2: 98% 94%    Last Pain:  Vitals:   11/15/22 1515  TempSrc:   PainSc: Asleep                 Mariann Barter

## 2022-11-15 NOTE — Interval H&P Note (Signed)
History and Physical Interval Note:  11/15/2022 10:45 AM  Christopher Wells  has presented today for surgery, with the diagnosis of PROSTATE CANCER.  The various methods of treatment have been discussed with the patient and family. After consideration of risks, benefits and other options for treatment, the patient has consented to  Procedure(s): XI ROBOTIC ASSISTED LAPAROSCOPIC RADICAL PROSTATECTOMY LEVEL 2 (N/A) BILATERAL PELVIC LYMPHADENECTOMY (Bilateral) as a surgical intervention.  The patient's history has been reviewed, patient examined, no change in status, stable for surgery.  I have reviewed the patient's chart and labs.  Questions were answered to the patient's satisfaction.     [employer]

## 2022-11-16 ENCOUNTER — Encounter (HOSPITAL_COMMUNITY): Payer: Self-pay | Admitting: Urology

## 2022-11-16 DIAGNOSIS — C61 Malignant neoplasm of prostate: Secondary | ICD-10-CM | POA: Diagnosis not present

## 2022-11-16 LAB — HEMOGLOBIN AND HEMATOCRIT, BLOOD
HCT: 36 % — ABNORMAL LOW (ref 39.0–52.0)
HCT: 36.6 % — ABNORMAL LOW (ref 39.0–52.0)
Hemoglobin: 12.4 g/dL — ABNORMAL LOW (ref 13.0–17.0)
Hemoglobin: 12.2 g/dL — ABNORMAL LOW (ref 13.0–17.0)

## 2022-11-16 MED ORDER — TRAMADOL HCL 50 MG PO TABS: 50.0000 mg | ORAL_TABLET | Freq: Four times a day (QID) | ORAL | Status: DC | PRN

## 2022-11-16 MED ORDER — BISACODYL 10 MG RE SUPP
10.0000 mg | Freq: Once | RECTAL | Status: AC
  Administered 2022-11-16: 10 mg via RECTAL
  Filled 2022-11-16: qty 1

## 2022-11-16 NOTE — TOC CM/SW Note (Signed)
Transition of Care [facility]) - Inpatient Brief Assessment   Patient Details  Name: Christopher Wells MRN: [id number] Date of Birth: 01-20-1965  Transition of Care [facility]) CM/SW Contact:    Howell Rucks, RN Phone Number: 11/16/2022, 11:23 AM   Clinical Narrative: Met with pt and spouse at bedside to introduce role of TOC/NCM and review for dc planning. Pt confirms he has an established PCP and pharmacy, no current home care services for home DME, reports she feels safe returning home with support from his spouse, spouse to provide transportation at discharge. TOC Brief Assessment completed. No TOC needs identified at this time.     Transition of Care Asessment: Insurance and Status: Insurance coverage has been reviewed Patient has primary care physician: Yes Home environment has been reviewed: resides in private residence with spouse Prior level of function:: Independent Prior/Current Home Services: No current home services Social Determinants of Health Reivew: SDOH reviewed no interventions necessary Readmission risk has been reviewed: Yes Transition of care needs: no transition of care needs at this time

## 2022-11-16 NOTE — Discharge Summary (Signed)
  Date of admission: 11/15/2022  Date of discharge: 11/16/2022  Admission diagnosis: Prostate Cancer  Discharge diagnosis: Prostate Cancer  History and Physical: For full details, please see admission history and physical. Briefly, Christopher Wells is a 57 y.o. gentleman with localized prostate cancer.  After discussing management/treatment options, he elected to proceed with surgical treatment.  Hospital Course: Christopher Wells was taken to the operating room on 11/15/2022 and underwent a robotic assisted laparoscopic radical prostatectomy. He tolerated this procedure well and without complications. Postoperatively, he was able to be transferred to a regular hospital room following recovery from anesthesia.  He was able to begin ambulating the night of surgery. He remained hemodynamically stable overnight.  He had excellent urine output with appropriately minimal output from his pelvic drain and his pelvic drain was removed on POD #1.  He was transitioned to oral pain medication, tolerated a clear liquid diet, and had met all discharge criteria and was able to be discharged home later on POD#1.  Laboratory values:  Recent Labs    11/15/22 1452 11/16/22 0358 11/16/22 1030  HGB 14.1 12.4* 12.2*  HCT 41.8 36.0* 36.6*    Disposition: Home  Discharge instruction: He was instructed to be ambulatory but to refrain from heavy lifting, strenuous activity, or driving. He was instructed on urethral catheter care.  Discharge medications:   Allergies as of 11/16/2022   No Known Allergies      Medication List     TAKE these medications    acetaminophen 500 MG tablet Commonly known as: TYLENOL Take 1,000 mg by mouth every 6 (six) hours as needed.   albuterol 108 (90 Base) MCG/ACT inhaler Commonly known as: VENTOLIN HFA Inhale 1-2 puffs into the lungs every 4 (four) hours as needed for shortness of breath (bronchospasm).   docusate sodium 100 MG capsule Commonly known as: COLACE Take 1  capsule (100 mg total) by mouth 2 (two) times daily.   sulfamethoxazole-trimethoprim 800-160 MG tablet Commonly known as: BACTRIM DS Take 1 tablet by mouth 2 (two) times daily. Start the day prior to foley removal appointment   traMADol 50 MG tablet Commonly known as: Ultram Take 1-2 tablets (50-100 mg total) by mouth every 6 (six) hours as needed for moderate pain (pain score 4-6) or severe pain (pain score 7-10).        Followup: He will followup in 1 week for catheter removal and to discuss his surgical pathology results.

## 2022-11-16 NOTE — Progress Notes (Addendum)
1 Day Post-Op Subjective: The patient is doing well.  No nausea or vomiting. Pain is adequately controlled. Softer BP's, pt asymptomatic. Remains HDS  Objective: Vital signs in last 24 hours: Temp:  [97.5 F (36.4 C)-99.5 F (37.5 C)] 99.5 F (37.5 C) (11/12 0015) Pulse Rate:  [51-87] 51 (11/12 0538) Resp:  [13-22] 16 (11/12 0015) BP: (89-131)/(50-76) 90/58 (11/12 0538) SpO2:  [94 %-100 %] 100 % (11/12 0538) Weight:  [66.6 kg] 66.6 kg (11/11 2237)  Intake/Output from previous day: 11/11 0701 - 11/12 0700 In: 2691 [P.O.:290; I.V.:2201; IV Piggyback:200] Out: 2020 [Urine:1900; Drains:70; Blood:50] Intake/Output this shift: Total I/O In: 1051 [P.O.:50; I.V.:901; IV Piggyback:100] Out: 1970 [Urine:1900; Drains:70]  Physical Exam:  General: Alert and oriented. CV: RRR Lungs: Clear bilaterally. GI: Soft, Nondistended. Incisions: Clean, dry, and intact Urine: Clear Extremities: Nontender, no erythema, no edema.  Lab Results: Recent Labs    11/15/22 1452 11/16/22 0358  HGB 14.1 12.4*  HCT 41.8 36.0*      Assessment/Plan: POD# 1 s/p robotic prostatectomy.  1) SL IVF 2) Ambulate, Incentive spirometry 3) Transition to oral pain medication 4) D/C pelvic drain 5) Plan for likely discharge later today   Roby Lofts, MD Resident Physician [facility]    LOS: 0 days   Zettie Pho 11/16/2022, 6:55 AM   I have seen and examined the patient and agree with the above assessment and plan.  Will recheck H/H later this morning.  If stable and doing well, will be able to D/C home at that time.

## 2022-11-19 LAB — SURGICAL PATHOLOGY

## 2022-11-22 DIAGNOSIS — C61 Malignant neoplasm of prostate: Secondary | ICD-10-CM

## 2022-11-22 NOTE — Progress Notes (Signed)
RN spoke with patient to follow up since robotic prostatectomy on 11/15/22.  Patient is scheduled for his post op on 11/19.  Denies any questions or concerns at this time.  Education provided on post PSA monitoring.

## 2022-11-26 ENCOUNTER — Encounter: Payer: Self-pay | Admitting: *Deleted

## 2022-11-30 ENCOUNTER — Encounter: Payer: Self-pay | Admitting: *Deleted

## 2023-01-13 ENCOUNTER — Encounter: Payer: Self-pay | Admitting: *Deleted

## 2023-01-13 ENCOUNTER — Inpatient Hospital Stay: Payer: 59 | Attending: Radiation Oncology | Admitting: *Deleted

## 2023-02-02 ENCOUNTER — Ambulatory Visit: Payer: 59 | Admitting: Family Medicine

## 2023-02-07 ENCOUNTER — Encounter: Payer: Self-pay | Admitting: *Deleted

## 2023-02-08 ENCOUNTER — Encounter: Payer: Self-pay | Admitting: *Deleted

## 2023-02-08 ENCOUNTER — Inpatient Hospital Stay: Payer: 59 | Attending: Adult Health | Admitting: *Deleted

## 2023-02-08 NOTE — Progress Notes (Addendum)
Called pt at 1:00p for scheduled SCP telephone visit. No answer but left message to VM and cb numBer. This appt is the second no show. Pt has been malied his summary and a copy sent to PCP. A letter was sent to patient.

## 2023-03-22 ENCOUNTER — Ambulatory Visit: Admitting: Family Medicine

## 2023-07-15 ENCOUNTER — Emergency Department (HOSPITAL_COMMUNITY): Payer: Worker's Compensation

## 2023-07-15 ENCOUNTER — Encounter (HOSPITAL_COMMUNITY): Payer: Self-pay | Admitting: Emergency Medicine

## 2023-07-15 ENCOUNTER — Emergency Department (HOSPITAL_COMMUNITY)
Admission: EM | Admit: 2023-07-15 | Discharge: 2023-07-15 | Disposition: A | Discharge status: Home / Self Care | Payer: Worker's Compensation | Attending: Emergency Medicine | Admitting: Emergency Medicine

## 2023-07-15 DIAGNOSIS — M545 Low back pain, unspecified: Secondary | ICD-10-CM | POA: Insufficient documentation

## 2023-07-15 DIAGNOSIS — R519 Headache, unspecified: Secondary | ICD-10-CM | POA: Diagnosis not present

## 2023-07-15 DIAGNOSIS — Y92410 Unspecified street and highway as the place of occurrence of the external cause: Secondary | ICD-10-CM | POA: Diagnosis not present

## 2023-07-15 DIAGNOSIS — M542 Cervicalgia: Secondary | ICD-10-CM | POA: Insufficient documentation

## 2023-07-15 MED ORDER — METHOCARBAMOL 500 MG PO TABS: 500.0000 mg | ORAL_TABLET | Freq: Two times a day (BID) | ORAL | 0 refills | Status: AC

## 2023-07-15 MED ORDER — IBUPROFEN 600 MG PO TABS: 600.0000 mg | ORAL_TABLET | Freq: Four times a day (QID) | ORAL | 0 refills | Status: AC | PRN

## 2023-07-15 MED ORDER — ACETAMINOPHEN 500 MG PO TABS
1000.0000 mg | ORAL_TABLET | Freq: Once | ORAL | Status: AC
  Administered 2023-07-15: 1000 mg via ORAL
  Filled 2023-07-15: qty 2
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
## 2023-09-06 ENCOUNTER — Encounter: Payer: Self-pay | Admitting: Sports Medicine
